# Patient Record
Sex: Female | Born: 1970
Health system: Southern US, Community
[De-identification: ages and names within clinical notes are randomized; demographics above are authoritative.]

## PROBLEM LIST (undated history)

## (undated) DIAGNOSIS — B019 Varicella without complication: Secondary | ICD-10-CM

## (undated) DIAGNOSIS — T7840XA Allergy, unspecified, initial encounter: Secondary | ICD-10-CM

## (undated) HISTORY — DX: Allergy, unspecified, initial encounter: T78.40XA

## (undated) HISTORY — DX: Varicella without complication: B01.9

---

## 1997-11-12 ENCOUNTER — Other Ambulatory Visit: Admission: RE | Admit: 1997-11-12 | Discharge: 1997-11-12 | Payer: Self-pay | Admitting: Obstetrics and Gynecology

## 1998-12-14 ENCOUNTER — Inpatient Hospital Stay (HOSPITAL_COMMUNITY): Admission: AD | Admit: 1998-12-14 | Discharge: 1998-12-17 | Payer: Self-pay | Admitting: *Deleted

## 1999-01-14 ENCOUNTER — Other Ambulatory Visit: Admission: RE | Admit: 1999-01-14 | Discharge: 1999-01-14 | Payer: Self-pay | Admitting: Obstetrics & Gynecology

## 1999-12-19 ENCOUNTER — Other Ambulatory Visit: Admission: RE | Admit: 1999-12-19 | Discharge: 1999-12-19 | Payer: Self-pay | Admitting: Obstetrics and Gynecology

## 2000-01-25 ENCOUNTER — Emergency Department (HOSPITAL_COMMUNITY): Admission: EM | Admit: 2000-01-25 | Discharge: 2000-01-25 | Payer: Self-pay | Admitting: Emergency Medicine

## 2001-01-24 ENCOUNTER — Other Ambulatory Visit: Admission: RE | Admit: 2001-01-24 | Discharge: 2001-01-24 | Payer: Self-pay | Admitting: Obstetrics and Gynecology

## 2001-01-27 ENCOUNTER — Encounter: Admission: RE | Admit: 2001-01-27 | Discharge: 2001-01-27 | Payer: Self-pay | Admitting: Obstetrics and Gynecology

## 2001-01-27 ENCOUNTER — Encounter: Payer: Self-pay | Admitting: Obstetrics and Gynecology

## 2001-10-30 ENCOUNTER — Encounter: Payer: Self-pay | Admitting: Emergency Medicine

## 2001-10-30 ENCOUNTER — Emergency Department (HOSPITAL_COMMUNITY): Admission: EM | Admit: 2001-10-30 | Discharge: 2001-10-30 | Payer: Self-pay | Admitting: Emergency Medicine

## 2002-06-27 ENCOUNTER — Other Ambulatory Visit: Admission: RE | Admit: 2002-06-27 | Discharge: 2002-06-27 | Payer: Self-pay | Admitting: Obstetrics and Gynecology

## 2004-06-05 ENCOUNTER — Other Ambulatory Visit: Admission: RE | Admit: 2004-06-05 | Discharge: 2004-06-05 | Payer: Self-pay | Admitting: Obstetrics and Gynecology

## 2015-11-18 ENCOUNTER — Ambulatory Visit (INDEPENDENT_AMBULATORY_CARE_PROVIDER_SITE_OTHER): Payer: 59 | Admitting: Physician Assistant

## 2015-11-18 ENCOUNTER — Encounter: Payer: Self-pay | Admitting: Physician Assistant

## 2015-11-18 VITALS — BP 110/78 | HR 82 | Temp 97.8°F | Resp 14 | Ht 67.0 in | Wt 157.0 lb

## 2015-11-18 DIAGNOSIS — Z0000 Encounter for general adult medical examination without abnormal findings: Secondary | ICD-10-CM | POA: Insufficient documentation

## 2015-11-18 LAB — CBC
HCT: 39.9 % (ref 36.0–46.0)
Hemoglobin: 13.3 g/dL (ref 12.0–15.0)
MCHC: 33.4 g/dL (ref 30.0–36.0)
MCV: 104.8 fl — ABNORMAL HIGH (ref 78.0–100.0)
Platelets: 345 10*3/uL (ref 150.0–400.0)
RBC: 3.8 Mil/uL — ABNORMAL LOW (ref 3.87–5.11)
RDW: 14.5 % (ref 11.5–15.5)
WBC: 5.5 10*3/uL (ref 4.0–10.5)

## 2015-11-18 LAB — COMPREHENSIVE METABOLIC PANEL
ALT: 52 U/L — ABNORMAL HIGH (ref 0–35)
AST: 71 U/L — ABNORMAL HIGH (ref 0–37)
Albumin: 4.2 g/dL (ref 3.5–5.2)
Alkaline Phosphatase: 80 U/L (ref 39–117)
BUN: 9 mg/dL (ref 6–23)
CO2: 29 mEq/L (ref 19–32)
Calcium: 8.7 mg/dL (ref 8.4–10.5)
Chloride: 102 mEq/L (ref 96–112)
Creatinine, Ser: 0.64 mg/dL (ref 0.40–1.20)
GFR: 107.23 mL/min (ref >60.00)
Glucose, Bld: 79 mg/dL (ref 70–99)
Potassium: 4.5 mEq/L (ref 3.5–5.1)
Sodium: 140 mEq/L (ref 135–145)
Total Bilirubin: 0.4 mg/dL (ref 0.2–1.2)
Total Protein: 7 g/dL (ref 6.0–8.3)

## 2015-11-18 LAB — URINALYSIS, ROUTINE W REFLEX MICROSCOPIC
Bilirubin Urine: NEGATIVE
Ketones, ur: NEGATIVE
Leukocytes, UA: NEGATIVE
Nitrite: NEGATIVE
RBC / HPF: NONE SEEN (ref 0–?)
Specific Gravity, Urine: >=1.03 — AB (ref 1.000–1.030)
Total Protein, Urine: NEGATIVE
Urine Glucose: NEGATIVE
Urobilinogen, UA: 0.2 (ref 0.0–1.0)
WBC, UA: NONE SEEN (ref 0–?)
pH: 5.5 (ref 5.0–8.0)

## 2015-11-18 LAB — LIPID PANEL
Cholesterol: 282 mg/dL — ABNORMAL HIGH (ref 0–200)
HDL: 78.9 mg/dL (ref >39.00)
LDL Cholesterol: 179 mg/dL — ABNORMAL HIGH (ref 0–99)
NonHDL: 202.71
Total CHOL/HDL Ratio: 4
Triglycerides: 119 mg/dL (ref 0.0–149.0)
VLDL: 23.8 mg/dL (ref 0.0–40.0)

## 2015-11-18 LAB — TSH: TSH: 1 u[IU]/mL (ref 0.35–4.50)

## 2015-11-18 LAB — VITAMIN D 25 HYDROXY (VIT D DEFICIENCY, FRACTURES): VITD: 23.79 ng/mL — ABNORMAL LOW (ref 30.00–100.00)

## 2015-11-18 NOTE — Progress Notes (Signed)
Patient presents to clinic today to establish care. Body mass index is 24.59 kg/m. Has been working with a Systems analystpersonal trainer since July. Working out 3 days per week. Patient endorses well-balanced diet. Is working hard on diet and exercise. Mother recently diagnosed with NASH so she is trying to do all she can with diet to prevent getting this herself.   Acute Concerns: Patient denies acute concerns at today's visit.   Chronic Issues: Allergies -- Environmental. Year-round. Zyrtec daily with good relief.   Health Maintenance: Immunizations -- Is unsure of Tetanus. Will check old records. Declines flu shot today.  Mammogram -- Has mammogram scheduled for December. + hx of dense breast tissue. No known family history of breast cancer (adopted).  PAP -- Last in 10/2014. + hx of abnormal PAP smear in 20s. No recent abnormal PAP. Has yearly PAP with GYN.     Past Medical History:  Diagnosis Date  . Allergy    Year-round. Multiple environmental allergies.  . Chickenpox    5th grade    Past Surgical History:  Procedure Laterality Date  . CESAREAN SECTION  2000    No current outpatient prescriptions on file prior to visit.   No current facility-administered medications on file prior to visit.     Allergies  Allergen Reactions  . Erythromycin Nausea Only    Family History  Problem Relation Age of Onset  . Adopted: Yes    Social History   Social History  . Marital status: Married    Spouse name: N/A  . Number of children: N/A  . Years of education: N/A   Occupational History  . 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 Smoking status: Never Smoker  . Smokeless tobacco: Never Used  . Alcohol use 1.8 oz/week    3 Glasses of wine per week  . Drug use: No  . Sexual activity: Yes    Birth control/ protection: IUD     Comment: Mirena   Other Topics Concern  . Not on file   Social History Narrative  . No narrative on file    Review of  Systems  Constitutional: Negative for fever and weight loss.  HENT: Negative for ear discharge, ear pain, hearing loss and tinnitus.        Chronic nasal allergy symptoms  Eyes: Negative for blurred vision, double vision, photophobia and pain.  Respiratory: Negative for cough and shortness of breath.   Cardiovascular: Negative for chest pain and palpitations.  Gastrointestinal: Negative for abdominal pain, blood in stool, constipation, diarrhea, heartburn, melena, nausea and vomiting.  Genitourinary: Negative for dysuria, flank pain, frequency, hematuria and urgency.  Musculoskeletal: Negative for falls.  Neurological: Negative for dizziness, loss of consciousness and headaches.  Endo/Heme/Allergies: Negative for environmental allergies.  Psychiatric/Behavioral: Negative for depression, hallucinations, substance abuse and suicidal ideas. The patient is not nervous/anxious and does not have insomnia.    BP 110/78   Pulse 82   Temp 97.8 F (36.6 C) (Oral)   Resp 14   Ht 5\' 7"  (1.702 m)   Wt 157 lb (71.2 kg)   SpO2 97%   BMI 24.59 kg/m   Physical Exam  Constitutional: She is oriented to person, place, and time and well-developed, well-nourished, and in no distress.  HENT:  Head: Normocephalic and atraumatic.  Right Ear: Tympanic membrane, external ear and ear canal normal.  Left Ear: Tympanic membrane, external ear and ear canal normal.  Nose: Nose normal. No mucosal edema.  Mouth/Throat: Uvula is midline, oropharynx is clear and moist and mucous membranes are normal. No oropharyngeal exudate or posterior oropharyngeal erythema.  Eyes: Conjunctivae are normal. Pupils are equal, round, and reactive to light.  Neck: Neck supple. No thyromegaly present.  Cardiovascular: Normal rate, regular rhythm, normal heart sounds and intact distal pulses.   Pulmonary/Chest: Effort normal and breath sounds normal. No respiratory distress. She has no wheezes. She has no rales.  Abdominal: Soft.  Bowel sounds are normal. She exhibits no distension and no mass. There is no tenderness. There is no rebound and no guarding.  Lymphadenopathy:    She has no cervical adenopathy.  Neurological: She is alert and oriented to person, place, and time. No cranial nerve deficit.  Skin: Skin is warm and dry. No rash noted.  Psychiatric: Affect normal.  Vitals reviewed.  Assessment/Plan: Visit for preventive health examination Depression screen negative. Health Maintenance reviewed -- Declines flu shot. Will obtain old records for tetanus. PAP up-to-date (yearly with GYN). Mammogram scheduled. Preventive schedule discussed and handout given in AVS. Will obtain fasting labs today.   Piedad ClimesMartin, Madonna Flegal Cody, PA-C

## 2015-11-18 NOTE — Patient Instructions (Signed)
Please go to the lab for blood work.   Our office will call you with your results unless you have chosen to receive results via MyChart.  If your blood work is normal we will follow-up each year for physicals and as scheduled for chronic medical problems.  If anything is abnormal we will treat accordingly and get you in for a follow-up.  Continue allergy medications as directed.  Keep up with your diet and exercise!  Preventive Care for Adults, Female A healthy lifestyle and preventive care can promote health and wellness. Preventive health guidelines for women include the following key practices.  A routine yearly physical is a good way to check with your health care provider about your health and preventive screening. It is a chance to share any concerns and updates on your health and to receive a thorough exam.  Visit your dentist for a routine exam and preventive care every 6 months. Brush your teeth twice a day and floss once a day. Good oral hygiene prevents tooth decay and gum disease.  The frequency of eye exams is based on your age, health, family medical history, use of contact lenses, and other factors. Follow your health care provider's recommendations for frequency of eye exams.  Eat a healthy diet. Foods like vegetables, fruits, whole grains, low-fat dairy products, and lean protein foods contain the nutrients you need without too many calories. Decrease your intake of foods high in solid fats, added sugars, and salt. Eat the right amount of calories for you.Get information about a proper diet from your health care provider, if necessary.  Regular physical exercise is one of the most important things you can do for your health. Most adults should get at least 150 minutes of moderate-intensity exercise (any activity that increases your heart rate and causes you to sweat) each week. In addition, most adults need muscle-strengthening exercises on 2 or more days a week.  Maintain  a healthy weight. The body mass index (BMI) is a screening tool to identify possible weight problems. It provides an estimate of body fat based on height and weight. Your health care provider can find your BMI and can help you achieve or maintain a healthy weight.For adults 20 years and older:  A BMI below 18.5 is considered underweight.  A BMI of 18.5 to 24.9 is normal.  A BMI of 25 to 29.9 is considered overweight.  A BMI of 30 and above is considered obese.  Maintain normal blood lipids and cholesterol levels by exercising and minimizing your intake of saturated fat. Eat a balanced diet with plenty of fruit and vegetables. Blood tests for lipids and cholesterol should begin at age 85 and be repeated every 5 years. If your lipid or cholesterol levels are high, you are over 50, or you are at high risk for heart disease, you may need your cholesterol levels checked more frequently.Ongoing high lipid and cholesterol levels should be treated with medicines if diet and exercise are not working.  If you smoke, find out from your health care provider how to quit. If you do not use tobacco, do not start.  Lung cancer screening is recommended for adults aged 10-80 years who are at high risk for developing lung cancer because of a history of smoking. A yearly low-dose CT scan of the lungs is recommended for people who have at least a 30-pack-year history of smoking and are a current smoker or have quit within the past 15 years. A pack year of smoking  is smoking an average of 1 pack of cigarettes a day for 1 year (for example: 1 pack a day for 30 years or 2 packs a day for 15 years). Yearly screening should continue until the smoker has stopped smoking for at least 15 years. Yearly screening should be stopped for people who develop a health problem that would prevent them from having lung cancer treatment.  If you are pregnant, do not drink alcohol. If you are breastfeeding, be very cautious about drinking  alcohol. If you are not pregnant and choose to drink alcohol, do not have more than 1 drink per day. One drink is considered to be 12 ounces (355 mL) of beer, 5 ounces (148 mL) of wine, or 1.5 ounces (44 mL) of liquor.  Avoid use of street drugs. Do not share needles with anyone. Ask for help if you need support or instructions about stopping the use of drugs.  High blood pressure causes heart disease and increases the risk of stroke. Your blood pressure should be checked at least every 1 to 2 years. Ongoing high blood pressure should be treated with medicines if weight loss and exercise do not work.  If you are 66-37 years old, ask your health care provider if you should take aspirin to prevent strokes.  Diabetes screening is done by taking a blood sample to check your blood glucose level after you have not eaten for a certain period of time (fasting). If you are not overweight and you do not have risk factors for diabetes, you should be screened once every 3 years starting at age 27. If you are overweight or obese and you are 74-40 years of age, you should be screened for diabetes every year as part of your cardiovascular risk assessment.  Breast cancer screening is essential preventive care for women. You should practice "breast self-awareness." This means understanding the normal appearance and feel of your breasts and may include breast self-examination. Any changes detected, no matter how small, should be reported to a health care provider. Women in their 16s and 30s should have a clinical breast exam (CBE) by a health care provider as part of a regular health exam every 1 to 3 years. After age 75, women should have a CBE every year. Starting at age 104, women should consider having a mammogram (breast X-ray test) every year. Women who have a family history of breast cancer should talk to their health care provider about genetic screening. Women at a high risk of breast cancer should talk to their  health care providers about having an MRI and a mammogram every year.  Breast cancer gene (BRCA)-related cancer risk assessment is recommended for women who have family members with BRCA-related cancers. BRCA-related cancers include breast, ovarian, tubal, and peritoneal cancers. Having family members with these cancers may be associated with an increased risk for harmful changes (mutations) in the breast cancer genes BRCA1 and BRCA2. Results of the assessment will determine the need for genetic counseling and BRCA1 and BRCA2 testing.  Your health care provider may recommend that you be screened regularly for cancer of the pelvic organs (ovaries, uterus, and vagina). This screening involves a pelvic examination, including checking for microscopic changes to the surface of your cervix (Pap test). You may be encouraged to have this screening done every 3 years, beginning at age 47.  For women ages 30-65, health care providers may recommend pelvic exams and Pap testing every 3 years, or they may recommend the Pap and pelvic  exam, combined with testing for human papilloma virus (HPV), every 5 years. Some types of HPV increase your risk of cervical cancer. Testing for HPV may also be done on women of any age with unclear Pap test results.  Other health care providers may not recommend any screening for nonpregnant women who are considered low risk for pelvic cancer and who do not have symptoms. Ask your health care provider if a screening pelvic exam is right for you.  If you have had past treatment for cervical cancer or a condition that could lead to cancer, you need Pap tests and screening for cancer for at least 20 years after your treatment. If Pap tests have been discontinued, your risk factors (such as having a new sexual partner) need to be reassessed to determine if screening should resume. Some women have medical problems that increase the chance of getting cervical cancer. In these cases, your health  care provider may recommend more frequent screening and Pap tests.  Colorectal cancer can be detected and often prevented. Most routine colorectal cancer screening begins at the age of 76 years and continues through age 22 years. However, your health care provider may recommend screening at an earlier age if you have risk factors for colon cancer. On a yearly basis, your health care provider may provide home test kits to check for hidden blood in the stool. Use of a small camera at the end of a tube, to directly examine the colon (sigmoidoscopy or colonoscopy), can detect the earliest forms of colorectal cancer. Talk to your health care provider about this at age 81, when routine screening begins. Direct exam of the colon should be repeated every 5-10 years through age 2 years, unless early forms of precancerous polyps or small growths are found.  People who are at an increased risk for hepatitis B should be screened for this virus. You are considered at high risk for hepatitis B if:  You were born in a country where hepatitis B occurs often. Talk with your health care provider about which countries are considered high risk.  Your parents were born in a high-risk country and you have not received a shot to protect against hepatitis B (hepatitis B vaccine).  You have HIV or AIDS.  You use needles to inject street drugs.  You live with, or have sex with, someone who has hepatitis B.  You get hemodialysis treatment.  You take certain medicines for conditions like cancer, organ transplantation, and autoimmune conditions.  Hepatitis C blood testing is recommended for all people born from [birth year] through 1965 and any individual with known risks for hepatitis C.  Practice safe sex. Use condoms and avoid high-risk sexual practices to reduce the spread of sexually transmitted infections (STIs). STIs include gonorrhea, chlamydia, syphilis, trichomonas, herpes, HPV, and human immunodeficiency virus (HIV).  Herpes, HIV, and HPV are viral illnesses that have no cure. They can result in disability, cancer, and death.  You should be screened for sexually transmitted illnesses (STIs) including gonorrhea and chlamydia if:  You are sexually active and are younger than 24 years.  You are older than 24 years and your health care provider tells you that you are at risk for this type of infection.  Your sexual activity has changed since you were last screened and you are at an increased risk for chlamydia or gonorrhea. Ask your health care provider if you are at risk.  If you are at risk of being infected with HIV, it is recommended  that you take a prescription medicine daily to prevent HIV infection. This is called preexposure prophylaxis (PrEP). You are considered at risk if:  You are sexually active and do not regularly use condoms or know the HIV status of your partner(s).  You take drugs by injection.  You are sexually active with a partner who has HIV.  Talk with your health care provider about whether you are at high risk of being infected with HIV. If you choose to begin PrEP, you should first be tested for HIV. You should then be tested every 3 months for as long as you are taking PrEP.  Osteoporosis is a disease in which the bones lose minerals and strength with aging. This can result in serious bone fractures or breaks. The risk of osteoporosis can be identified using a bone density scan. Women ages 51 years and over and women at risk for fractures or osteoporosis should discuss screening with their health care providers. Ask your health care provider whether you should take a calcium supplement or vitamin D to reduce the rate of osteoporosis.  Menopause can be associated with physical symptoms and risks. Hormone replacement therapy is available to decrease symptoms and risks. You should talk to your health care provider about whether hormone replacement therapy is right for you.  Use  sunscreen. Apply sunscreen liberally and repeatedly throughout the day. You should seek shade when your shadow is shorter than you. Protect yourself by wearing long sleeves, pants, a wide-brimmed hat, and sunglasses year round, whenever you are outdoors.  Once a month, do a whole body skin exam, using a mirror to look at the skin on your back. Tell your health care provider of new moles, moles that have irregular borders, moles that are larger than a pencil eraser, or moles that have changed in shape or color.  Stay current with required vaccines (immunizations).  Influenza vaccine. All adults should be immunized every year.  Tetanus, diphtheria, and acellular pertussis (Td, Tdap) vaccine. Pregnant women should receive 1 dose of Tdap vaccine during each pregnancy. The dose should be obtained regardless of the length of time since the last dose. Immunization is preferred during the 27th-36th week of gestation. An adult who has not previously received Tdap or who does not know her vaccine status should receive 1 dose of Tdap. This initial dose should be followed by tetanus and diphtheria toxoids (Td) booster doses every 10 years. Adults with an unknown or incomplete history of completing a 3-dose immunization series with Td-containing vaccines should begin or complete a primary immunization series including a Tdap dose. Adults should receive a Td booster every 10 years.  Varicella vaccine. An adult without evidence of immunity to varicella should receive 2 doses or a second dose if she has previously received 1 dose. Pregnant females who do not have evidence of immunity should receive the first dose after pregnancy. This first dose should be obtained before leaving the health care facility. The second dose should be obtained 4-8 weeks after the first dose.  Human papillomavirus (HPV) vaccine. Females aged 13-26 years who have not received the vaccine previously should obtain the 3-dose series. The vaccine  is not recommended for use in pregnant females. However, pregnancy testing is not needed before receiving a dose. If a female is found to be pregnant after receiving a dose, no treatment is needed. In that case, the remaining doses should be delayed until after the pregnancy. Immunization is recommended for any person with an immunocompromised condition  through the age of 17 years if she did not get any or all doses earlier. During the 3-dose series, the second dose should be obtained 4-8 weeks after the first dose. The third dose should be obtained 24 weeks after the first dose and 16 weeks after the second dose.  Zoster vaccine. One dose is recommended for adults aged 72 years or older unless certain conditions are present.  Measles, mumps, and rubella (MMR) vaccine. Adults born before [birth year] generally are considered immune to measles and mumps. Adults born in [birth year] or later should have 1 or more doses of MMR vaccine unless there is a contraindication to the vaccine or there is laboratory evidence of immunity to each of the three diseases. A routine second dose of MMR vaccine should be obtained at least 28 days after the first dose for students attending postsecondary schools, health care workers, or international travelers. People who received inactivated measles vaccine or an unknown type of measles vaccine during 1963-1967 should receive 2 doses of MMR vaccine. People who received inactivated mumps vaccine or an unknown type of mumps vaccine before 1979 and are at high risk for mumps infection should consider immunization with 2 doses of MMR vaccine. For females of childbearing age, rubella immunity should be determined. If there is no evidence of immunity, females who are not pregnant should be vaccinated. If there is no evidence of immunity, females who are pregnant should delay immunization until after pregnancy. Unvaccinated health care workers born before [birth year] who lack laboratory evidence of measles,  mumps, or rubella immunity or laboratory confirmation of disease should consider measles and mumps immunization with 2 doses of MMR vaccine or rubella immunization with 1 dose of MMR vaccine.  Pneumococcal 13-valent conjugate (PCV13) vaccine. When indicated, a person who is uncertain of his immunization history and has no record of immunization should receive the PCV13 vaccine. All adults 23 years of age and older should receive this vaccine. An adult aged 20 years or older who has certain medical conditions and has not been previously immunized should receive 1 dose of PCV13 vaccine. This PCV13 should be followed with a dose of pneumococcal polysaccharide (PPSV23) vaccine. Adults who are at high risk for pneumococcal disease should obtain the PPSV23 vaccine at least 8 weeks after the dose of PCV13 vaccine. Adults older than 43 years of age who have normal immune system function should obtain the PPSV23 vaccine dose at least 1 year after the dose of PCV13 vaccine.  Pneumococcal polysaccharide (PPSV23) vaccine. When PCV13 is also indicated, PCV13 should be obtained first. All adults aged 37 years and older should be immunized. An adult younger than age 73 years who has certain medical conditions should be immunized. Any person who resides in a nursing home or long-term care facility should be immunized. An adult smoker should be immunized. People with an immunocompromised condition and certain other conditions should receive both PCV13 and PPSV23 vaccines. People with human immunodeficiency virus (HIV) infection should be immunized as soon as possible after diagnosis. Immunization during chemotherapy or radiation therapy should be avoided. Routine use of PPSV23 vaccine is not recommended for American Indians, [place] Natives, or people younger than 65 years unless there are medical conditions that require PPSV23 vaccine. When indicated, people who have unknown immunization and have no record of immunization should  receive PPSV23 vaccine. One-time revaccination 5 years after the first dose of PPSV23 is recommended for people aged 19-64 years who have chronic kidney failure, nephrotic syndrome, asplenia, or immunocompromised  conditions. People who received 1-2 doses of PPSV23 before age 32 years should receive another dose of PPSV23 vaccine at age 74 years or later if at least 5 years have passed since the previous dose. Doses of PPSV23 are not needed for people immunized with PPSV23 at or after age 8 years.  Meningococcal vaccine. Adults with asplenia or persistent complement component deficiencies should receive 2 doses of quadrivalent meningococcal conjugate (MenACWY-D) vaccine. The doses should be obtained at least 2 months apart. Microbiologists working with certain meningococcal bacteria, [place] recruits, people at risk during an outbreak, and people who travel to or live in countries with a high rate of meningitis should be immunized. A first-year college student up through age 12 years who is living in a residence hall should receive a dose if she did not receive a dose on or after her 16th birthday. Adults who have certain high-risk conditions should receive one or more doses of vaccine.  Hepatitis A vaccine. Adults who wish to be protected from this disease, have certain high-risk conditions, work with hepatitis A-infected animals, work in hepatitis A research labs, or travel to or work in countries with a high rate of hepatitis A should be immunized. Adults who were previously unvaccinated and who anticipate close contact with an international adoptee during the first 60 days after arrival in the [place] from a country with a high rate of hepatitis A should be immunized.  Hepatitis B vaccine. Adults who wish to be protected from this disease, have certain high-risk conditions, may be exposed to blood or other infectious body fluids, are household contacts or sex partners of hepatitis B positive people,  are clients or workers in certain care facilities, or travel to or work in countries with a high rate of hepatitis B should be immunized.  Haemophilus influenzae type b (Hib) vaccine. A previously unvaccinated person with asplenia or sickle cell disease or having a scheduled splenectomy should receive 1 dose of Hib vaccine. Regardless of previous immunization, a recipient of a hematopoietic stem cell transplant should receive a 3-dose series 6-12 months after her successful transplant. Hib vaccine is not recommended for adults with HIV infection. Preventive Services / Frequency Ages 17 to 81 years  Blood pressure check.** / Every 3-5 years.  Lipid and cholesterol check.** / Every 5 years beginning at age 28.  Clinical breast exam.** / Every 3 years for women in their 17s and 44s.  BRCA-related cancer risk assessment.** / For women who have family members with a BRCA-related cancer (breast, ovarian, tubal, or peritoneal cancers).  Pap test.** / Every 2 years from ages 43 through 35. Every 3 years starting at age 48 through age 73 or 59 with a history of 3 consecutive normal Pap tests.  HPV screening.** / Every 3 years from ages 22 through ages 21 to 34 with a history of 3 consecutive normal Pap tests.  Hepatitis C blood test.** / For any individual with known risks for hepatitis C.  Skin self-exam. / Monthly.  Influenza vaccine. / Every year.  Tetanus, diphtheria, and acellular pertussis (Tdap, Td) vaccine.** / Consult your health care provider. Pregnant women should receive 1 dose of Tdap vaccine during each pregnancy. 1 dose of Td every 10 years.  Varicella vaccine.** / Consult your health care provider. Pregnant females who do not have evidence of immunity should receive the first dose after pregnancy.  HPV vaccine. / 3 doses over 6 months, if 25 and younger. The vaccine is not recommended for  use in pregnant females. However, pregnancy testing is not needed before receiving a  dose.  Measles, mumps, rubella (MMR) vaccine.** / You need at least 1 dose of MMR if you were born in 1957 or later. You may also need a 2nd dose. For females of childbearing age, rubella immunity should be determined. If there is no evidence of immunity, females who are not pregnant should be vaccinated. If there is no evidence of immunity, females who are pregnant should delay immunization until after pregnancy.  Pneumococcal 13-valent conjugate (PCV13) vaccine.** / Consult your health care provider.  Pneumococcal polysaccharide (PPSV23) vaccine.** / 1 to 2 doses if you smoke cigarettes or if you have certain conditions.  Meningococcal vaccine.** / 1 dose if you are age 67 to 6 years and a Market researcher living in a residence hall, or have one of several medical conditions, you need to get vaccinated against meningococcal disease. You may also need additional booster doses.  Hepatitis A vaccine.** / Consult your health care provider.  Hepatitis B vaccine.** / Consult your health care provider.  Haemophilus influenzae type b (Hib) vaccine.** / Consult your health care provider. Ages 53 to 53 years  Blood pressure check.** / Every year.  Lipid and cholesterol check.** / Every 5 years beginning at age 55 years.  Lung cancer screening. / Every year if you are aged 77-80 years and have a 30-pack-year history of smoking and currently smoke or have quit within the past 15 years. Yearly screening is stopped once you have quit smoking for at least 15 years or develop a health problem that would prevent you from having lung cancer treatment.  Clinical breast exam.** / Every year after age 72 years.  BRCA-related cancer risk assessment.** / For women who have family members with a BRCA-related cancer (breast, ovarian, tubal, or peritoneal cancers).  Mammogram.** / Every year beginning at age 72 years and continuing for as long as you are in good health. Consult with your health care  provider.  Pap test.** / Every 3 years starting at age 8 years through age 52 or 26 years with a history of 3 consecutive normal Pap tests.  HPV screening.** / Every 3 years from ages 71 years through ages 86 to 60 years with a history of 3 consecutive normal Pap tests.  Fecal occult blood test (FOBT) of stool. / Every year beginning at age 44 years and continuing until age 7 years. You may not need to do this test if you get a colonoscopy every 10 years.  Flexible sigmoidoscopy or colonoscopy.** / Every 5 years for a flexible sigmoidoscopy or every 10 years for a colonoscopy beginning at age 78 years and continuing until age 82 years.  Hepatitis C blood test.** / For all people born from [birth year] through 1963 and any individual with known risks for hepatitis C.  Skin self-exam. / Monthly.  Influenza vaccine. / Every year.  Tetanus, diphtheria, and acellular pertussis (Tdap/Td) vaccine.** / Consult your health care provider. Pregnant women should receive 1 dose of Tdap vaccine during each pregnancy. 1 dose of Td every 10 years.  Varicella vaccine.** / Consult your health care provider. Pregnant females who do not have evidence of immunity should receive the first dose after pregnancy.  Zoster vaccine.** / 1 dose for adults aged 28 years or older.  Measles, mumps, rubella (MMR) vaccine.** / You need at least 1 dose of MMR if you were born in 1957 or later. You may also need a second  dose. For females of childbearing age, rubella immunity should be determined. If there is no evidence of immunity, females who are not pregnant should be vaccinated. If there is no evidence of immunity, females who are pregnant should delay immunization until after pregnancy.  Pneumococcal 13-valent conjugate (PCV13) vaccine.** / Consult your health care provider.  Pneumococcal polysaccharide (PPSV23) vaccine.** / 1 to 2 doses if you smoke cigarettes or if you have certain conditions.  Meningococcal vaccine.** /  Consult your health care provider.  Hepatitis A vaccine.** / Consult your health care provider.  Hepatitis B vaccine.** / Consult your health care provider.  Haemophilus influenzae type b (Hib) vaccine.** / Consult your health care provider. Ages 28 years and over  Blood pressure check.** / Every year.  Lipid and cholesterol check.** / Every 5 years beginning at age 54 years.  Lung cancer screening. / Every year if you are aged 83-80 years and have a 30-pack-year history of smoking and currently smoke or have quit within the past 15 years. Yearly screening is stopped once you have quit smoking for at least 15 years or develop a health problem that would prevent you from having lung cancer treatment.  Clinical breast exam.** / Every year after age 55 years.  BRCA-related cancer risk assessment.** / For women who have family members with a BRCA-related cancer (breast, ovarian, tubal, or peritoneal cancers).  Mammogram.** / Every year beginning at age 31 years and continuing for as long as you are in good health. Consult with your health care provider.  Pap test.** / Every 3 years starting at age 28 years through age 80 or 85 years with 3 consecutive normal Pap tests. Testing can be stopped between 65 and 70 years with 3 consecutive normal Pap tests and no abnormal Pap or HPV tests in the past 10 years.  HPV screening.** / Every 3 years from ages 70 years through ages 52 or 14 years with a history of 3 consecutive normal Pap tests. Testing can be stopped between 65 and 70 years with 3 consecutive normal Pap tests and no abnormal Pap or HPV tests in the past 10 years.  Fecal occult blood test (FOBT) of stool. / Every year beginning at age 39 years and continuing until age 28 years. You may not need to do this test if you get a colonoscopy every 10 years.  Flexible sigmoidoscopy or colonoscopy.** / Every 5 years for a flexible sigmoidoscopy or every 10 years for a colonoscopy beginning at age  35 years and continuing until age 37 years.  Hepatitis C blood test.** / For all people born from [birth year] through 1963 and any individual with known risks for hepatitis C.  Osteoporosis screening.** / A one-time screening for women ages 34 years and over and women at risk for fractures or osteoporosis.  Skin self-exam. / Monthly.  Influenza vaccine. / Every year.  Tetanus, diphtheria, and acellular pertussis (Tdap/Td) vaccine.** / 1 dose of Td every 10 years.  Varicella vaccine.** / Consult your health care provider.  Zoster vaccine.** / 1 dose for adults aged 63 years or older.  Pneumococcal 13-valent conjugate (PCV13) vaccine.** / Consult your health care provider.  Pneumococcal polysaccharide (PPSV23) vaccine.** / 1 dose for all adults aged 21 years and older.  Meningococcal vaccine.** / Consult your health care provider.  Hepatitis A vaccine.** / Consult your health care provider.  Hepatitis B vaccine.** / Consult your health care provider.  Haemophilus influenzae type b (Hib) vaccine.** / Consult your health care  provider. ** Family history and personal history of risk and conditions may change your health care provider's recommendations.   This information is not intended to replace advice given to you by your health care provider. Make sure you discuss any questions you have with your health care provider.   Document Released: 02/17/2001 Document Revised: 01/12/2014 Document Reviewed: 05/19/2010 Elsevier Interactive Patient Education [employer].

## 2015-11-18 NOTE — Assessment & Plan Note (Signed)
Depression screen negative. Health Maintenance reviewed -- Declines flu shot. Will obtain old records for tetanus. PAP up-to-date (yearly with GYN). Mammogram scheduled. Preventive schedule discussed and handout given in AVS. Will obtain fasting labs today.

## 2015-11-18 NOTE — Progress Notes (Signed)
Pre visit review using our clinic review tool, if applicable. No additional management support is needed unless otherwise documented below in the visit note. 

## 2015-11-18 NOTE — Addendum Note (Signed)
Addended by: Con MemosMOORE, Demeshia Sherburne S on: 11/18/2015 11:01 AM   Modules accepted: Orders

## 2015-11-19 ENCOUNTER — Other Ambulatory Visit: Payer: Self-pay | Admitting: Emergency Medicine

## 2015-11-19 DIAGNOSIS — R748 Abnormal levels of other serum enzymes: Secondary | ICD-10-CM

## 2015-11-19 MED ORDER — VITAMIN D-3 25 MCG (1000 UT) PO CAPS: 1.0000 | ORAL_CAPSULE | Freq: Every day | ORAL | 0 refills | Status: AC

## 2015-12-03 ENCOUNTER — Other Ambulatory Visit: Payer: 59

## 2016-01-30 DIAGNOSIS — Z01419 Encounter for gynecological examination (general) (routine) without abnormal findings: Secondary | ICD-10-CM | POA: Diagnosis not present

## 2016-01-30 DIAGNOSIS — Z124 Encounter for screening for malignant neoplasm of cervix: Secondary | ICD-10-CM | POA: Diagnosis not present

## 2016-02-18 DIAGNOSIS — Z23 Encounter for immunization: Secondary | ICD-10-CM | POA: Diagnosis not present

## 2017-01-28 DIAGNOSIS — M9901 Segmental and somatic dysfunction of cervical region: Secondary | ICD-10-CM | POA: Diagnosis not present

## 2017-01-28 DIAGNOSIS — M531 Cervicobrachial syndrome: Secondary | ICD-10-CM | POA: Diagnosis not present

## 2017-01-28 DIAGNOSIS — M9902 Segmental and somatic dysfunction of thoracic region: Secondary | ICD-10-CM | POA: Diagnosis not present

## 2017-03-04 DIAGNOSIS — Z124 Encounter for screening for malignant neoplasm of cervix: Secondary | ICD-10-CM | POA: Diagnosis not present

## 2017-03-04 DIAGNOSIS — Z1231 Encounter for screening mammogram for malignant neoplasm of breast: Secondary | ICD-10-CM | POA: Diagnosis not present

## 2017-03-04 DIAGNOSIS — Z01419 Encounter for gynecological examination (general) (routine) without abnormal findings: Secondary | ICD-10-CM | POA: Diagnosis not present

## 2017-04-12 DIAGNOSIS — M531 Cervicobrachial syndrome: Secondary | ICD-10-CM | POA: Diagnosis not present

## 2017-04-12 DIAGNOSIS — M9902 Segmental and somatic dysfunction of thoracic region: Secondary | ICD-10-CM | POA: Diagnosis not present

## 2017-04-12 DIAGNOSIS — M9901 Segmental and somatic dysfunction of cervical region: Secondary | ICD-10-CM | POA: Diagnosis not present

## 2017-05-28 ENCOUNTER — Encounter: Payer: Self-pay | Admitting: Emergency Medicine

## 2017-08-23 DIAGNOSIS — M9901 Segmental and somatic dysfunction of cervical region: Secondary | ICD-10-CM | POA: Diagnosis not present

## 2017-08-23 DIAGNOSIS — M9902 Segmental and somatic dysfunction of thoracic region: Secondary | ICD-10-CM | POA: Diagnosis not present

## 2017-08-23 DIAGNOSIS — M531 Cervicobrachial syndrome: Secondary | ICD-10-CM | POA: Diagnosis not present

## 2017-08-30 DIAGNOSIS — M9902 Segmental and somatic dysfunction of thoracic region: Secondary | ICD-10-CM | POA: Diagnosis not present

## 2017-08-30 DIAGNOSIS — M531 Cervicobrachial syndrome: Secondary | ICD-10-CM | POA: Diagnosis not present

## 2017-08-30 DIAGNOSIS — M9901 Segmental and somatic dysfunction of cervical region: Secondary | ICD-10-CM | POA: Diagnosis not present

## 2018-10-20 ENCOUNTER — Other Ambulatory Visit: Payer: Self-pay | Admitting: Physician Assistant

## 2018-10-20 DIAGNOSIS — M25561 Pain in right knee: Secondary | ICD-10-CM

## 2018-10-31 ENCOUNTER — Other Ambulatory Visit: Payer: 59

## 2018-12-07 LAB — HM PAP SMEAR: HM Pap smear: NEGATIVE

## 2018-12-07 LAB — HM MAMMOGRAPHY

## 2019-01-07 ENCOUNTER — Emergency Department (HOSPITAL_COMMUNITY): Payer: 59

## 2019-01-07 ENCOUNTER — Inpatient Hospital Stay (HOSPITAL_COMMUNITY)
Admission: EM | Admit: 2019-01-07 | Discharge: 2019-01-12 | DRG: 439 | Disposition: A | Discharge status: Home / Self Care | Payer: 59 | Attending: Internal Medicine | Admitting: Internal Medicine

## 2019-01-07 ENCOUNTER — Other Ambulatory Visit: Payer: Self-pay

## 2019-01-07 ENCOUNTER — Encounter (HOSPITAL_COMMUNITY): Payer: Self-pay | Admitting: Emergency Medicine

## 2019-01-07 DIAGNOSIS — K760 Fatty (change of) liver, not elsewhere classified: Secondary | ICD-10-CM | POA: Diagnosis present

## 2019-01-07 DIAGNOSIS — R112 Nausea with vomiting, unspecified: Secondary | ICD-10-CM | POA: Diagnosis present

## 2019-01-07 DIAGNOSIS — E876 Hypokalemia: Secondary | ICD-10-CM | POA: Diagnosis present

## 2019-01-07 DIAGNOSIS — F10230 Alcohol dependence with withdrawal, uncomplicated: Secondary | ICD-10-CM | POA: Diagnosis not present

## 2019-01-07 DIAGNOSIS — R7989 Other specified abnormal findings of blood chemistry: Secondary | ICD-10-CM | POA: Diagnosis not present

## 2019-01-07 DIAGNOSIS — I10 Essential (primary) hypertension: Secondary | ICD-10-CM | POA: Diagnosis present

## 2019-01-07 DIAGNOSIS — K859 Acute pancreatitis without necrosis or infection, unspecified: Secondary | ICD-10-CM

## 2019-01-07 DIAGNOSIS — R160 Hepatomegaly, not elsewhere classified: Secondary | ICD-10-CM | POA: Diagnosis present

## 2019-01-07 DIAGNOSIS — Z20822 Contact with and (suspected) exposure to covid-19: Secondary | ICD-10-CM | POA: Diagnosis present

## 2019-01-07 DIAGNOSIS — F1010 Alcohol abuse, uncomplicated: Secondary | ICD-10-CM

## 2019-01-07 DIAGNOSIS — Z720 Tobacco use: Secondary | ICD-10-CM

## 2019-01-07 DIAGNOSIS — D7589 Other specified diseases of blood and blood-forming organs: Secondary | ICD-10-CM

## 2019-01-07 DIAGNOSIS — F10939 Alcohol use, unspecified with withdrawal, unspecified: Secondary | ICD-10-CM

## 2019-01-07 DIAGNOSIS — K852 Alcohol induced acute pancreatitis without necrosis or infection: Principal | ICD-10-CM

## 2019-01-07 DIAGNOSIS — R9431 Abnormal electrocardiogram [ECG] [EKG]: Secondary | ICD-10-CM | POA: Diagnosis not present

## 2019-01-07 DIAGNOSIS — F17210 Nicotine dependence, cigarettes, uncomplicated: Secondary | ICD-10-CM | POA: Diagnosis present

## 2019-01-07 DIAGNOSIS — F10239 Alcohol dependence with withdrawal, unspecified: Secondary | ICD-10-CM | POA: Diagnosis present

## 2019-01-07 LAB — CREATININE, SERUM
Creatinine, Ser: 0.53 mg/dL (ref 0.44–1.00)
GFR calc Af Amer: >60 mL/min (ref >60)
GFR calc non Af Amer: >60 mL/min (ref >60)

## 2019-01-07 LAB — CBC
HCT: 40.2 % (ref 36.0–46.0)
HCT: 41.3 % (ref 36.0–46.0)
Hemoglobin: 13.9 g/dL (ref 12.0–15.0)
Hemoglobin: 14.1 g/dL (ref 12.0–15.0)
MCH: 38.4 pg — ABNORMAL HIGH (ref 26.0–34.0)
MCH: 38.5 pg — ABNORMAL HIGH (ref 26.0–34.0)
MCHC: 34.6 g/dL (ref 30.0–36.0)
MCHC: 34.1 g/dL (ref 30.0–36.0)
MCV: 111 fL — ABNORMAL HIGH (ref 80.0–100.0)
MCV: 112.8 fL — ABNORMAL HIGH (ref 80.0–100.0)
Platelets: 223 10*3/uL (ref 150–400)
Platelets: 215 10*3/uL (ref 150–400)
RBC: 3.62 MIL/uL — ABNORMAL LOW (ref 3.87–5.11)
RBC: 3.66 MIL/uL — ABNORMAL LOW (ref 3.87–5.11)
RDW: 13.2 % (ref 11.5–15.5)
RDW: 13.2 % (ref 11.5–15.5)
WBC: 9.3 10*3/uL (ref 4.0–10.5)
WBC: 11.9 10*3/uL — ABNORMAL HIGH (ref 4.0–10.5)
nRBC: 0 % (ref 0.0–0.2)
nRBC: 0 % (ref 0.0–0.2)

## 2019-01-07 LAB — COMPREHENSIVE METABOLIC PANEL
ALT: 82 U/L — ABNORMAL HIGH (ref 0–44)
AST: 155 U/L — ABNORMAL HIGH (ref 15–41)
Albumin: 4 g/dL (ref 3.5–5.0)
Alkaline Phosphatase: 154 U/L — ABNORMAL HIGH (ref 38–126)
Anion gap: 18 — ABNORMAL HIGH (ref 5–15)
BUN: 7 mg/dL (ref 6–20)
CO2: 25 mmol/L (ref 22–32)
Calcium: 8.5 mg/dL — ABNORMAL LOW (ref 8.9–10.3)
Chloride: 95 mmol/L — ABNORMAL LOW (ref 98–111)
Creatinine, Ser: 0.56 mg/dL (ref 0.44–1.00)
GFR calc Af Amer: >60 mL/min (ref >60)
GFR calc non Af Amer: >60 mL/min (ref >60)
Glucose, Bld: 124 mg/dL — ABNORMAL HIGH (ref 70–99)
Potassium: 2.9 mmol/L — ABNORMAL LOW (ref 3.5–5.1)
Sodium: 138 mmol/L (ref 135–145)
Total Bilirubin: 2.4 mg/dL — ABNORMAL HIGH (ref 0.3–1.2)
Total Protein: 7.5 g/dL (ref 6.5–8.1)

## 2019-01-07 LAB — PROTIME-INR
INR: 1.1 (ref 0.8–1.2)
Prothrombin Time: 14.2 seconds (ref 11.4–15.2)

## 2019-01-07 LAB — TSH
TSH: 1.007 u[IU]/mL (ref 0.350–4.500)
TSH: 1.075 u[IU]/mL (ref 0.350–4.500)

## 2019-01-07 LAB — LIPID PANEL
Cholesterol: 244 mg/dL — ABNORMAL HIGH (ref 0–200)
HDL: 51 mg/dL (ref >40)
LDL Cholesterol: 175 mg/dL — ABNORMAL HIGH (ref 0–99)
Total CHOL/HDL Ratio: 4.8 RATIO
Triglycerides: 92 mg/dL (ref <150)
VLDL: 18 mg/dL (ref 0–40)

## 2019-01-07 LAB — SARS CORONAVIRUS 2 (TAT 6-24 HRS): SARS Coronavirus 2: NEGATIVE

## 2019-01-07 LAB — URINALYSIS, ROUTINE W REFLEX MICROSCOPIC
Bacteria, UA: NONE SEEN
Bilirubin Urine: NEGATIVE
Glucose, UA: NEGATIVE mg/dL
Hgb urine dipstick: NEGATIVE
Ketones, ur: 80 mg/dL — AB
Leukocytes,Ua: NEGATIVE
Nitrite: NEGATIVE
Protein, ur: 30 mg/dL — AB
Specific Gravity, Urine: 1.018 (ref 1.005–1.030)
pH: 6 (ref 5.0–8.0)

## 2019-01-07 LAB — HIV ANTIBODY (ROUTINE TESTING W REFLEX): HIV Screen 4th Generation wRfx: NONREACTIVE

## 2019-01-07 LAB — LIPASE, BLOOD: Lipase: 569 U/L — ABNORMAL HIGH (ref 11–51)

## 2019-01-07 LAB — MAGNESIUM: Magnesium: 1 mg/dL — ABNORMAL LOW (ref 1.7–2.4)

## 2019-01-07 LAB — I-STAT BETA HCG BLOOD, ED (MC, WL, AP ONLY): I-stat hCG, quantitative: <5 m[IU]/mL (ref <5)

## 2019-01-07 LAB — VITAMIN B12: Vitamin B-12: 190 pg/mL (ref 180–914)

## 2019-01-07 LAB — ETHANOL: Alcohol, Ethyl (B): 63 mg/dL — ABNORMAL HIGH (ref <10)

## 2019-01-07 MED ORDER — HYDRALAZINE HCL 25 MG PO TABS
25.0000 mg | ORAL_TABLET | Freq: Four times a day (QID) | ORAL | Status: DC | PRN
  Administered 2019-01-07: 25 mg via ORAL
  Filled 2019-01-07: qty 1

## 2019-01-07 MED ORDER — ADULT MULTIVITAMIN W/MINERALS CH
1.0000 | ORAL_TABLET | Freq: Every day | ORAL | Status: DC
  Administered 2019-01-08 – 2019-01-12 (×5): 1 via ORAL
  Filled 2019-01-07 (×5): qty 1

## 2019-01-07 MED ORDER — LORAZEPAM 1 MG PO TABS
1.0000 mg | ORAL_TABLET | ORAL | Status: AC | PRN
  Administered 2019-01-07 – 2019-01-08 (×2): 2 mg via ORAL
  Filled 2019-01-07 (×2): qty 2

## 2019-01-07 MED ORDER — LORAZEPAM 2 MG/ML IJ SOLN
1.0000 mg | INTRAMUSCULAR | Status: AC | PRN
  Administered 2019-01-07 – 2019-01-08 (×4): 2 mg via INTRAVENOUS
  Administered 2019-01-08: 3 mg via INTRAVENOUS
  Administered 2019-01-08 (×3): 2 mg via INTRAVENOUS
  Administered 2019-01-09 (×3): 3 mg via INTRAVENOUS
  Administered 2019-01-09 (×3): 2 mg via INTRAVENOUS
  Administered 2019-01-10 (×4): 4 mg via INTRAVENOUS
  Filled 2019-01-07 (×3): qty 1
  Filled 2019-01-07: qty 2
  Filled 2019-01-07: qty 1
  Filled 2019-01-07: qty 2
  Filled 2019-01-07 (×2): qty 1
  Filled 2019-01-07 (×2): qty 2
  Filled 2019-01-07: qty 1
  Filled 2019-01-07: qty 2
  Filled 2019-01-07 (×3): qty 1
  Filled 2019-01-07: qty 2
  Filled 2019-01-07 (×2): qty 1
  Filled 2019-01-07: qty 2

## 2019-01-07 MED ORDER — FOLIC ACID 1 MG PO TABS
1.0000 mg | ORAL_TABLET | Freq: Every day | ORAL | Status: DC
  Administered 2019-01-08 – 2019-01-12 (×5): 1 mg via ORAL
  Filled 2019-01-07 (×5): qty 1

## 2019-01-07 MED ORDER — IOHEXOL 300 MG/ML  SOLN
100.0000 mL | Freq: Once | INTRAMUSCULAR | Status: AC | PRN
  Administered 2019-01-07: 100 mL via INTRAVENOUS

## 2019-01-07 MED ORDER — THIAMINE HCL 100 MG/ML IJ SOLN: 100.0000 mg | Freq: Every day | INTRAMUSCULAR | Status: DC

## 2019-01-07 MED ORDER — PROMETHAZINE HCL 25 MG/ML IJ SOLN
25.0000 mg | Freq: Once | INTRAMUSCULAR | Status: AC
  Administered 2019-01-07: 25 mg via INTRAVENOUS
  Filled 2019-01-07: qty 1

## 2019-01-07 MED ORDER — OXYCODONE HCL 5 MG PO TABS
5.0000 mg | ORAL_TABLET | ORAL | Status: DC | PRN
  Administered 2019-01-07 – 2019-01-11 (×3): 5 mg via ORAL
  Filled 2019-01-07 (×3): qty 1

## 2019-01-07 MED ORDER — ONDANSETRON HCL 4 MG/2ML IJ SOLN
4.0000 mg | Freq: Four times a day (QID) | INTRAMUSCULAR | Status: DC | PRN
  Administered 2019-01-07: 4 mg via INTRAVENOUS
  Filled 2019-01-07: qty 2

## 2019-01-07 MED ORDER — SODIUM CHLORIDE 0.9 % IV SOLN
INTRAVENOUS | Status: DC | PRN
  Administered 2019-01-07: 1000 mL via INTRAVENOUS

## 2019-01-07 MED ORDER — MAGNESIUM SULFATE 2 GM/50ML IV SOLN
2.0000 g | Freq: Once | INTRAVENOUS | Status: AC
  Administered 2019-01-07: 2 g via INTRAVENOUS
  Filled 2019-01-07: qty 50

## 2019-01-07 MED ORDER — POTASSIUM CHLORIDE 10 MEQ/100ML IV SOLN
10.0000 meq | Freq: Once | INTRAVENOUS | Status: AC
  Administered 2019-01-07: 10 meq via INTRAVENOUS
  Filled 2019-01-07: qty 100

## 2019-01-07 MED ORDER — POTASSIUM CHLORIDE CRYS ER 20 MEQ PO TBCR
40.0000 meq | EXTENDED_RELEASE_TABLET | Freq: Once | ORAL | Status: AC
  Administered 2019-01-07: 40 meq via ORAL
  Filled 2019-01-07: qty 2

## 2019-01-07 MED ORDER — THIAMINE HCL 100 MG PO TABS
100.0000 mg | ORAL_TABLET | Freq: Every day | ORAL | Status: DC
  Administered 2019-01-08 – 2019-01-12 (×5): 100 mg via ORAL
  Filled 2019-01-07 (×5): qty 1

## 2019-01-07 MED ORDER — HEPARIN SODIUM (PORCINE) 5000 UNIT/ML IJ SOLN
5000.0000 [IU] | Freq: Three times a day (TID) | INTRAMUSCULAR | Status: DC
  Administered 2019-01-07 – 2019-01-12 (×12): 5000 [IU] via SUBCUTANEOUS
  Filled 2019-01-07 (×13): qty 1

## 2019-01-07 MED ORDER — SODIUM CHLORIDE 0.9% FLUSH: 3.0000 mL | Freq: Once | INTRAVENOUS | Status: DC

## 2019-01-07 MED ORDER — HYDROMORPHONE HCL 1 MG/ML IJ SOLN
1.0000 mg | Freq: Once | INTRAMUSCULAR | Status: AC
  Administered 2019-01-07: 1 mg via INTRAVENOUS
  Filled 2019-01-07: qty 1

## 2019-01-07 MED ORDER — CHLORDIAZEPOXIDE HCL 25 MG PO CAPS
25.0000 mg | ORAL_CAPSULE | Freq: Three times a day (TID) | ORAL | Status: DC
  Administered 2019-01-07 – 2019-01-11 (×12): 25 mg via ORAL
  Filled 2019-01-07 (×12): qty 1

## 2019-01-07 MED ORDER — SODIUM CHLORIDE (PF) 0.9 % IJ SOLN
INTRAMUSCULAR | Status: AC
  Filled 2019-01-07: qty 50

## 2019-01-07 MED ORDER — FOLIC ACID 5 MG/ML IJ SOLN: 1.0000 mg | Freq: Every day | INTRAMUSCULAR | Status: DC

## 2019-01-07 MED ORDER — THIAMINE HCL 100 MG/ML IJ SOLN
Freq: Once | INTRAVENOUS | Status: AC
  Filled 2019-01-07: qty 1000

## 2019-01-07 MED ORDER — LORAZEPAM 2 MG/ML IJ SOLN
0.0000 mg | Freq: Four times a day (QID) | INTRAMUSCULAR | Status: AC
  Administered 2019-01-07: 1 mg via INTRAVENOUS
  Administered 2019-01-08: 4 mg via INTRAVENOUS
  Administered 2019-01-08 (×2): 2 mg via INTRAVENOUS
  Administered 2019-01-09: 3 mg via INTRAVENOUS
  Administered 2019-01-09: 4 mg via INTRAVENOUS
  Filled 2019-01-07 (×3): qty 1
  Filled 2019-01-07: qty 2
  Filled 2019-01-07 (×2): qty 1
  Filled 2019-01-07: qty 2
  Filled 2019-01-07: qty 1

## 2019-01-07 MED ORDER — SODIUM CHLORIDE 0.9 % IV SOLN: INTRAVENOUS | Status: DC

## 2019-01-07 MED ORDER — LORAZEPAM 2 MG/ML IJ SOLN
0.0000 mg | Freq: Two times a day (BID) | INTRAMUSCULAR | Status: AC
  Administered 2019-01-09: 3 mg via INTRAVENOUS
  Administered 2019-01-10 (×2): 2 mg via INTRAVENOUS
  Administered 2019-01-11: 4 mg via INTRAVENOUS
  Filled 2019-01-07 (×2): qty 2
  Filled 2019-01-07 (×2): qty 1
  Filled 2019-01-07: qty 2

## 2019-01-07 MED ORDER — ONDANSETRON HCL 4 MG PO TABS: 4.0000 mg | ORAL_TABLET | Freq: Four times a day (QID) | ORAL | Status: DC | PRN

## 2019-01-07 MED ORDER — METOPROLOL TARTRATE 25 MG PO TABS
25.0000 mg | ORAL_TABLET | Freq: Two times a day (BID) | ORAL | Status: DC
  Administered 2019-01-07 – 2019-01-12 (×11): 25 mg via ORAL
  Filled 2019-01-07 (×3): qty 1
  Filled 2019-01-07: qty 2
  Filled 2019-01-07 (×3): qty 1
  Filled 2019-01-07: qty 2
  Filled 2019-01-07 (×3): qty 1

## 2019-01-07 MED ORDER — SODIUM CHLORIDE 0.9 % IV BOLUS
1000.0000 mL | Freq: Once | INTRAVENOUS | Status: AC
  Administered 2019-01-07: 1000 mL via INTRAVENOUS

## 2019-01-07 MED ORDER — MORPHINE SULFATE (PF) 4 MG/ML IV SOLN
4.0000 mg | Freq: Once | INTRAVENOUS | Status: AC
  Administered 2019-01-07: 4 mg via INTRAVENOUS
  Filled 2019-01-07: qty 1

## 2019-01-07 MED ORDER — HYDROMORPHONE HCL 1 MG/ML IJ SOLN
0.5000 mg | INTRAMUSCULAR | Status: DC | PRN
  Administered 2019-01-07 – 2019-01-08 (×5): 0.5 mg via INTRAVENOUS
  Filled 2019-01-07 (×5): qty 0.5

## 2019-01-07 NOTE — ED Triage Notes (Signed)
Pt reports vomiting for the last 7 hr. Pt actively vomiting at time of triage.

## 2019-01-07 NOTE — ED Provider Notes (Addendum)
Care assumed from C. Lawyer PA-C at shift change pending results of CMP, lipase, CT A/P.  See his note for full H&P.   Briefly this is a 49 year old female presenting with nausea and vomiting after drinking alcohol today. She admits to drinking alcohol daily. Plan is to follow up on results, PO challenge, and disposition accordingly. Work up by previous provider includes unremarkable CBC and UA.   Physical Exam  BP (!) 161/98   Pulse 80   Temp 98 F (36.7 C) (Oral)   Resp 16   Ht 5\' 7"  (1.702 m)   Wt 56.7 kg   SpO2 100%   BMI 19.58 kg/m   Physical Exam  PE: Constitutional: well-developed, well-nourished, no apparent distress HENT: normocephalic, atraumatic. no cervical adenopathy Cardiovascular: normal rate and rhythm, distal pulses intact Pulmonary/Chest: effort normal; breath sounds clear and equal bilaterally; no wheezes or rales Abdominal: soft. Generalized tenderness worse in epigastric area. No rebound or guarding. peritoneal signs. Musculoskeletal: full ROM, no edema Neurological: alert with goal directed thinking Skin: warm and dry, no rash, no diaphoresis Psychiatric: normal mood and affect, normal behavior    ED Course/Procedures   Results for orders placed or performed during the hospital encounter of 01/07/19 (from the past 24 hour(s))  Comprehensive metabolic panel     Status: Abnormal   Collection Time: 01/07/19  6:19 AM  Result Value Ref Range   Sodium 138 135 - 145 mmol/L   Potassium 2.9 (L) 3.5 - 5.1 mmol/L   Chloride 95 (L) 98 - 111 mmol/L   CO2 25 22 - 32 mmol/L   Glucose, Bld 124 (H) 70 - 99 mg/dL   BUN 7 6 - 20 mg/dL   Creatinine, Ser 03/07/19 0.44 - 1.00 mg/dL   Calcium 8.5 (L) 8.9 - 10.3 mg/dL   Total Protein 7.5 6.5 - 8.1 g/dL   Albumin 4.0 3.5 - 5.0 g/dL   AST 5.27 (H) 15 - 41 U/L   ALT 82 (H) 0 - 44 U/L   Alkaline Phosphatase 154 (H) 38 - 126 U/L   Total Bilirubin 2.4 (H) 0.3 - 1.2 mg/dL   GFR calc non Af Amer >60 >60 mL/min   GFR calc Af Amer  >60 >60 mL/min   Anion gap 18 (H) 5 - 15  CBC     Status: Abnormal   Collection Time: 01/07/19  6:19 AM  Result Value Ref Range   WBC 9.3 4.0 - 10.5 K/uL   RBC 3.62 (L) 3.87 - 5.11 MIL/uL   Hemoglobin 13.9 12.0 - 15.0 g/dL   HCT 03/07/19 42.3 - 53.6 %   MCV 111.0 (H) 80.0 - 100.0 fL   MCH 38.4 (H) 26.0 - 34.0 pg   MCHC 34.6 30.0 - 36.0 g/dL   RDW 14.4 31.5 - 40.0 %   Platelets 223 150 - 400 K/uL   nRBC 0.0 0.0 - 0.2 %  Urinalysis, Routine w reflex microscopic     Status: Abnormal   Collection Time: 01/07/19  6:19 AM  Result Value Ref Range   Color, Urine YELLOW YELLOW   APPearance CLEAR CLEAR   Specific Gravity, Urine 1.018 1.005 - 1.030   pH 6.0 5.0 - 8.0   Glucose, UA NEGATIVE NEGATIVE mg/dL   Hgb urine dipstick NEGATIVE NEGATIVE   Bilirubin Urine NEGATIVE NEGATIVE   Ketones, ur 80 (A) NEGATIVE mg/dL   Protein, ur 30 (A) NEGATIVE mg/dL   Nitrite NEGATIVE NEGATIVE   Leukocytes,Ua NEGATIVE NEGATIVE   RBC / HPF  0-5 0 - 5 RBC/hpf   WBC, UA 0-5 0 - 5 WBC/hpf   Bacteria, UA NONE SEEN NONE SEEN   Squamous Epithelial / LPF 0-5 0 - 5   Mucus PRESENT   Ethanol     Status: Abnormal   Collection Time: 01/07/19  6:19 AM  Result Value Ref Range   Alcohol, Ethyl (B) 63 (H) <10 mg/dL  I-Stat beta hCG blood, ED     Status: None   Collection Time: 01/07/19  6:26 AM  Result Value Ref Range   I-stat hCG, quantitative <5.0 <5 mIU/mL   Comment 3            EKG Interpretation  Date/Time:  Saturday January 07 2019 08:04:21 EST Ventricular Rate:  104 PR Interval:    QRS Duration: 79 QT Interval:  423 QTC Calculation: 557 R Axis:   81 Text Interpretation: Sinus tachycardia Left atrial enlargement Consider anterior infarct Borderline T abnormalities, inferior leads Prolonged QT interval No STEMI Confirmed by Alona Bene [phone]) on 01/07/2019 8:13:07 AM        CT ABDOMEN AND PELVIS WITH CONTRAST    TECHNIQUE:  Multidetector CT imaging of the abdomen and pelvis was performed  using the  standard protocol following bolus administration of  intravenous contrast.    CONTRAST: OMNIPAQUE IOHEXOL 300 MG/ML SOLN    COMPARISON: No pertinent prior studies available for comparison.    FINDINGS:  LOWER CHEST: The imaged lungs are clear.The visualized heart is  normal in size.    HEPATOBILIARY: Severe hepatic steatosis with hepatomegaly and  regions of focal fatty sparing. No appreciable biliary ductal  dilatation.    PANCREAS: The pancreas is edematous with surrounding peripancreatic  inflammatory stranding and edema. Edema and fluid extends into the  along the transverse and ascending colon, into the mesentery and  anterior pararenal space. No organized peripancreatic collection is  identified. No pancreatic necrosis is identified. No pancreatic  ductal dilatation.    SPLEEN: No focal lesion.No splenomegaly.    ADRENALS/URINARY TRACT:    No adrenal gland mass.    Symmetric renal enhancement. 4 mm hypodense lesion within the lower  pole of the right kidney too small to characterize. No  hydronephrosis. The bladder is unremarkable for the degree of  distension    STOMACH/BOWEL: Edema surrounding the stomach. The stomach otherwise  unremarkable. No dilated loops of bowel or bowel wall thickening.  Normal appendix.    VASCULAR/LYMPHATIC:    No abdominal aortic aneurysm. Minimal aortic atherosclerosis.    The IVC, portal vein, SMV and splenic vein are patent.    No lymphadenopathy.    REPRODUCTIVE: Incompletely assessed by CT modality.No pathologic  findings. Intrauterine device.    OTHER: Small volume pelvic free fluid.The body wall is normal.    MUSCULOSKELETAL: No acute bony abnormality.    IMPRESSION:  Findings consistent with acute pancreatitis. Prominent  peripancreatic stranding and fluid, with fluid extending along the  stomach, transverse and ascending colon and into the anterior  pararenal space. No organized  peripancreatic collection is  demonstrated on the current examination. No pancreatic necrosis.    Severe hepatic steatosis and hepatomegaly with regions of focal  fatty sparing.      Electronically Signed  By: Jackey Loge DO  On: 01/07/2019 08:24   .Critical Care Performed by: Sherene Sires, PA-C Authorized by: Sherene Sires, PA-C   Critical care provider statement:    Critical care time (minutes):  35   Critical care was  necessary to treat or prevent imminent or life-threatening deterioration of the following conditions:  Metabolic crisis   Critical care was time spent personally by me on the following activities:  Blood draw for specimens, development of treatment plan with patient or surrogate, discussions with consultants, evaluation of patient's response to treatment, obtaining history from patient or surrogate, review of old charts, re-evaluation of patient's condition, pulse oximetry, ordering and review of radiographic studies, ordering and performing treatments and interventions and ordering and review of laboratory studies   I assumed direction of critical care for this patient from another provider in my specialty: yes       MDM  Received in signout pending labs and CT abdomen pelvis. She has received IVF, morphine and phenergan for her symptoms.   Work-up shows CBC without leukocytosis, stable hemoglobin 13.9.  Ethanol is 63.  Beta hCG is negative.  UA was without signs of infection, does show 80 ketones, likely dehydration as she has had multiple episodes of emesis. Lipase elevated 569. CMP with multiple abnormalities including hypokalemia of 2.9, elevated liver enzymes with AST and ALT of 155 and 82, total bilirubin is 2.4, also with elevated anion gap of 18.  Will replace potassium with IV and p.o. EKG shows prolonged QTC of 557, hold off on further QT prolonging medications. Will give dilaudid for pain. If she continues to have nausea and emesis will  consider ativan, being careful to avoid use with benzos. CT A/P distant with acute pancreatitis. Radiologist also comments on prominent peripancreatic stranding and fluid, with fluid extending along the stomach, transverse and ascending colon and into the anterior pararenal space. She denies history of pancreatitis. Findings and plan of care discussed with supervising physician Dr. Laverta Baltimore who agrees with plan to admit. Spoke with Dr. Marylyn Ishihara with hospitalist service who agrees to assume care of patient and bring into the hospital for further evaluation and management.      Portions of this note were generated with Lobby[employer]. Dictation errors may occur despite best attempts at proofreading.    Cherre Robins, PA-C 01/07/19 0859    Cherre Robins, PA-C 01/07/19 0901    Margette Fast, MD 01/07/19 2045

## 2019-01-07 NOTE — Progress Notes (Signed)
Pt arrived from ED to room 1514. BP 194/111. Informed the ED during report to retake the VS and the last BP was 176/114. Attempted to call ED to ask if the pt could be given any medications to resolve this but never got through to anyone. Writer will administer prn Hydralazine. Will continue to monitor.

## 2019-01-07 NOTE — H&P (Signed)
.  History and Physical    Tammy Tran RSW:[id number] DOB: 1970/06/06 DOA: 01/07/2019  PCP: Patient, No Pcp Per  Patient coming from: Home   Chief Complaint: ab pain  HPI: Tammy Tran is a 49 y.o. female with medical history significant of EtOH abuse. Reports with 3 days of nausea that has worsened to epigastric and LUQ abdominal pain. She reports that a few days ago, she would feel nauseous through out the day; especially with eating. However, she could tolerate it for the most part. She did not try any OTCs or other meds to help. She was able to continue drinking her alcohol. She reports that yesterday when she tried to eat and drink, she had pain in the epigastric area and felt severely nauseous. She states she "pulled an all-nighter" with nausea and vomiting. She became concerned and sought help in the ED.   ED Course: CT revealed acute pancreatitis. [facility] was called for admission.   Review of Systems: Denies CP, dyspnea, HA, F, respiratory symptoms, sick contacts, seizure-like activity. Reports N, V, ab pain, "jitteriness". Remainder of 10 point review of systems is otherwise negative for all not mentioned in HPI.    History reviewed. No pertinent past medical history.  Past Surgical History:  Procedure Laterality Date  . CESAREAN SECTION       reports that she has been smoking cigarettes. She has been smoking about 0.25 packs per day. She has never used smokeless tobacco. She reports that she does not drink alcohol or use drugs.  No Known Allergies  History reviewed. No pertinent family history.  Prior to Admission medications   Not on File    Physical Exam: Vitals:   01/07/19 0512 01/07/19 0526 01/07/19 0618 01/07/19 0812  BP: (!) 167/97  (!) 161/98 (!) 180/105  Pulse: 69  80 81  Resp: 17  16 16   Temp: 98 F (36.7 C)     TempSrc: Oral     SpO2: 100%  100% 100%  Weight:  56.7 kg    Height:  5\' 7"  (1.702 m)      Constitutional: 49 y.o. female NAD, calm,  comfortable Vitals:   01/07/19 0512 01/07/19 0526 01/07/19 0618 01/07/19 0812  BP: (!) 167/97  (!) 161/98 (!) 180/105  Pulse: 69  80 81  Resp: 17  16 16   Temp: 98 F (36.7 C)     TempSrc: Oral     SpO2: 100%  100% 100%  Weight:  56.7 kg    Height:  5\' 7"  (1.702 m)     General: 49 y.o. female resting in bed somewhat anxious Eyes: PERRL, normal sclera ENMT: Nares patent w/o discharge, orophaynx clear, dentition normal, ears w/o discharge/lesions/ulcers Cardiovascular: tachy, +S1, S2, no m/g/r, equal pulses throughout Respiratory: CTABL, no w/r/r, normal WOB GI: BS+, ND; TTP LLQ, LUQ, epigastric, no masses noted MSK: No e/c/c Skin: No rashes, bruises, ulcerations noted; multiple tattoos noted Neuro: A&O x 3, no focal deficits Psyc: Appropriate interaction and affect, anxious but cooperative   Labs on Admission: I have personally reviewed following labs and imaging studies  CBC: Recent Labs  Lab 01/07/19 0619  WBC 9.3  HGB 13.9  HCT 40.2  MCV 111.0*  PLT 223   Basic Metabolic Panel: Recent Labs  Lab 01/07/19 0619  NA 138  K 2.9*  CL 95*  CO2 25  GLUCOSE 124*  BUN 7  CREATININE 0.56  CALCIUM 8.5*   GFR: Estimated Creatinine Clearance: 78.7 mL/min (by C-G formula based  on SCr of 0.56 mg/dL). Liver Function Tests: Recent Labs  Lab 01/07/19 0619  AST 155*  ALT 82*  ALKPHOS 154*  BILITOT 2.4*  PROT 7.5  ALBUMIN 4.0   Recent Labs  Lab 01/07/19 0619  LIPASE 569*   No results for input(s): AMMONIA in the last 168 hours. Coagulation Profile: No results for input(s): INR, PROTIME in the last 168 hours. Cardiac Enzymes: No results for input(s): CKTOTAL, CKMB, CKMBINDEX, TROPONINI in the last 168 hours. BNP (last 3 results) No results for input(s): PROBNP in the last 8760 hours. HbA1C: No results for input(s): HGBA1C in the last 72 hours. CBG: No results for input(s): GLUCAP in the last 168 hours. Lipid Profile: No results for input(s): CHOL, HDL,  LDLCALC, TRIG, CHOLHDL, LDLDIRECT in the last 72 hours. Thyroid Function Tests: No results for input(s): TSH, T4TOTAL, FREET4, T3FREE, THYROIDAB in the last 72 hours. Anemia Panel: No results for input(s): VITAMINB12, FOLATE, FERRITIN, TIBC, IRON, RETICCTPCT in the last 72 hours. Urine analysis:    Component Value Date/Time   COLORURINE YELLOW 01/07/2019 0619   APPEARANCEUR CLEAR 01/07/2019 0619   LABSPEC 1.018 01/07/2019 0619   PHURINE 6.0 01/07/2019 0619   GLUCOSEU NEGATIVE 01/07/2019 0619   HGBUR NEGATIVE 01/07/2019 0619   BILIRUBINUR NEGATIVE 01/07/2019 0619   KETONESUR 80 (A) 01/07/2019 0619   PROTEINUR 30 (A) 01/07/2019 0619   NITRITE NEGATIVE 01/07/2019 0619   LEUKOCYTESUR NEGATIVE 01/07/2019 3267    Radiological Exams on Admission: CT Abdomen Pelvis W Contrast  Result Date: 01/07/2019 CLINICAL DATA:  Abdominal pain, unspecified diffuse abdominal pain worse in left upper quadrant. Severe nausea and vomiting. EXAM: CT ABDOMEN AND PELVIS WITH CONTRAST TECHNIQUE: Multidetector CT imaging of the abdomen and pelvis was performed using the standard protocol following bolus administration of intravenous contrast. CONTRAST:  OMNIPAQUE IOHEXOL 300 MG/ML  SOLN COMPARISON:  No pertinent prior studies available for comparison. FINDINGS: LOWER CHEST: The imaged lungs are clear.The visualized heart is normal in size. HEPATOBILIARY: Severe hepatic steatosis with hepatomegaly and regions of focal fatty sparing. No appreciable biliary ductal dilatation. PANCREAS: The pancreas is edematous with surrounding peripancreatic inflammatory stranding and edema. Edema and fluid extends into the along the transverse and ascending colon, into the mesentery and anterior pararenal space. No organized peripancreatic collection is identified. No pancreatic necrosis is identified. No pancreatic ductal dilatation. SPLEEN: No focal lesion.No splenomegaly. ADRENALS/URINARY TRACT: No adrenal gland mass. Symmetric  renal enhancement. 4 mm hypodense lesion within the lower pole of the right kidney too small to characterize. No hydronephrosis. The bladder is unremarkable for the degree of distension STOMACH/BOWEL: Edema surrounding the stomach. The stomach otherwise unremarkable. No dilated loops of bowel or bowel wall thickening. Normal appendix. VASCULAR/LYMPHATIC: No abdominal aortic aneurysm.  Minimal aortic atherosclerosis. The IVC, portal vein, SMV and splenic vein are patent. No lymphadenopathy. REPRODUCTIVE: Incompletely assessed by CT modality.No pathologic findings. Intrauterine device. OTHER: Small volume pelvic free fluid.The body wall is normal. MUSCULOSKELETAL: No acute bony abnormality. IMPRESSION: Findings consistent with acute pancreatitis. Prominent peripancreatic stranding and fluid, with fluid extending along the stomach, transverse and ascending colon and into the anterior pararenal space. No organized peripancreatic collection is demonstrated on the current examination. No pancreatic necrosis. Severe hepatic steatosis and hepatomegaly with regions of focal fatty sparing. Electronically Signed   By: Jackey Loge DO   On: 01/07/2019 08:24    EKG: Independently reviewed. Sinus tach.  Assessment/Plan Principal Problem:   Acute pancreatitis Active Problems:   ETOH abuse  Alcohol withdrawal (HCC)   HTN (hypertension)   Elevated LFTs   Macrocytosis   Hypokalemia   Tobacco abuse    Acute Pancreatitis     - Ranson is 0     - reports that this is first episode of pancreatitis     - most likely EtOH is etiology     - no bilary duct dilation seen on CT     - no lipid panel ordered; will order     - continue fluids, will give banana bag as well     - May have sips of water, ice, meds; otherwise, NPO     - inpt, med-tele  EtOH abuse EtOH withdrawal     - drinks daily; "one large cup... I like vodka"     - reports that she will wake up fine, but by the end of the workday, she will have the  shakes if she does not have any alcohol     - she denies a Hx of withdrawal seizures     - HTN more likely related to withdrawal     - librium 25mg  TID; CIWA protocol, telemetry bed, banana bag, folate/thiamine daily     -  Check Mg2+, B12, B6     - counsled against further EtOH use  HTN     - likely related to above     - PRN hydralazine  Hypokalemia     - replete, check Mg2+  Elevated LFTs     - hepatic steatosis and hepatomegaly seen on CT     - check hepatitis panel, PT-INR     - follow labs  Macrocytosis     - check B12, Folate, TSH  Tobacco abuse     - counseled against further use     - she states she is stopping today     - started smoking a few months ago d/t family stress  DVT prophylaxis: heparin  Code Status: FULL  Family Communication: None at bedside  Disposition Plan: TBD  Consults called: None  Admission status: Inpatient. Need for IVF, IV pain control.     Jonnie Finner DO [facility]  If 7PM-7AM, please contact night-coverage [email or web address]  01/07/2019, 9:26 AM

## 2019-01-07 NOTE — ED Provider Notes (Signed)
[facility]-EMERGENCY DEPT Provider Note   CSN: [id number] Arrival date & time: 01/07/19  8527     History Chief Complaint  Patient presents with  . Vomiting    Tammy Tran is a 49 y.o. female.  HPI Patient presents to the emergency department with vomiting since around 6 PM.  The patient states she had some alcohol prior to the episode starting.  Patient states she had some alcohol but has not eaten since 2 PM.  The patient states nothing seems to make condition better or worse.  Patient states she took some Goody's powder but feels like she vomited shortly thereafter.  The patient denies chest pain, shortness of breath, headache,blurred vision, neck pain, fever, cough, weakness, numbness, dizziness, anorexia, edema, , diarrhea, rash, back pain, dysuria, hematemesis, bloody stool, near syncope, or syncope.    History reviewed. No pertinent past medical history.  There are no problems to display for this patient.   Past Surgical History:  Procedure Laterality Date  . CESAREAN SECTION       OB History   No obstetric history on file.     History reviewed. No pertinent family history.  Social History   Tobacco Use  . Smoking status: Current Every Day Smoker    Packs/day: 0.25    Types: Cigarettes  . Smokeless tobacco: Never Used  Substance Use Topics  . Alcohol use: Never  . Drug use: Never    Home Medications Prior to Admission medications   Not on File    Allergies    Patient has no known allergies.  Review of Systems   Review of Systems All other systems negative except as documented in the HPI. All pertinent positives and negatives as reviewed in the HPI. Physical Exam Updated Vital Signs BP (!) 167/97   Pulse 69   Temp 98 F (36.7 C) (Oral)   Resp 17   Ht 5\' 7"  (1.702 m)   Wt 56.7 kg   SpO2 100%   BMI 19.58 kg/m   Physical Exam Vitals and nursing note reviewed.  Constitutional:      General: She is not in acute  distress.    Appearance: She is well-developed.  HENT:     Head: Normocephalic and atraumatic.  Eyes:     Pupils: Pupils are equal, round, and reactive to light.  Cardiovascular:     Rate and Rhythm: Normal rate and regular rhythm.     Heart sounds: Normal heart sounds. No murmur. No friction rub. No gallop.   Pulmonary:     Effort: Pulmonary effort is normal. No respiratory distress.     Breath sounds: Normal breath sounds. No wheezing.  Abdominal:     General: Bowel sounds are normal. There is no distension.     Palpations: Abdomen is soft.     Tenderness: There is abdominal tenderness. There is no guarding.  Musculoskeletal:     Cervical back: Normal range of motion and neck supple.  Skin:    General: Skin is warm and dry.     Capillary Refill: Capillary refill takes less than 2 seconds.     Findings: No erythema or rash.  Neurological:     Mental Status: She is alert and oriented to person, place, and time.     Motor: No abnormal muscle tone.     Coordination: Coordination normal.  Psychiatric:        Behavior: Behavior normal.     ED Results / Procedures / Treatments  Labs (all labs ordered are listed, but only abnormal results are displayed) Labs Reviewed  LIPASE, BLOOD  COMPREHENSIVE METABOLIC PANEL  CBC  URINALYSIS, ROUTINE W REFLEX MICROSCOPIC  ETHANOL  I-STAT BETA HCG BLOOD, ED ([facility], [facility], [facility] ONLY)    EKG None  Radiology No results found.  Procedures Procedures (including critical care time)  Medications Ordered in ED Medications  sodium chloride flush (NS) 0.9 % injection 3 mL (has no administration in time range)  sodium chloride 0.9 % bolus 1,000 mL (has no administration in time range)  morphine 4 MG/ML injection 4 mg (has no administration in time range)  promethazine (PHENERGAN) injection 25 mg (has no administration in time range)    ED Course  I have reviewed the triage vital signs and the nursing notes.  Pertinent labs & imaging results  that were available during my care of the patient were reviewed by me and considered in my medical decision making (see chart for details).    MDM Rules/Calculators/A&P                      Patient will need CT scan imaging to further assess her mid abdominal pain.  Patient was given IV fluids and Phenergan.  I advised the patient of the plan thus far and all questions were answered. Final Clinical Impression(s) / ED Diagnoses Final diagnoses:  None    Rx / DC Orders ED Discharge Orders    None       Dalia Heading, PA-C 01/07/19 8921    Shanon Rosser, MD 01/07/19 [phone]

## 2019-01-08 LAB — COMPREHENSIVE METABOLIC PANEL
ALT: 53 U/L — ABNORMAL HIGH (ref 0–44)
AST: 80 U/L — ABNORMAL HIGH (ref 15–41)
Albumin: 3.2 g/dL — ABNORMAL LOW (ref 3.5–5.0)
Alkaline Phosphatase: 112 U/L (ref 38–126)
Anion gap: 11 (ref 5–15)
BUN: 10 mg/dL (ref 6–20)
CO2: 28 mmol/L (ref 22–32)
Calcium: 7.5 mg/dL — ABNORMAL LOW (ref 8.9–10.3)
Chloride: 96 mmol/L — ABNORMAL LOW (ref 98–111)
Creatinine, Ser: 0.47 mg/dL (ref 0.44–1.00)
GFR calc Af Amer: >60 mL/min (ref >60)
GFR calc non Af Amer: >60 mL/min (ref >60)
Glucose, Bld: 85 mg/dL (ref 70–99)
Potassium: 3.1 mmol/L — ABNORMAL LOW (ref 3.5–5.1)
Sodium: 135 mmol/L (ref 135–145)
Total Bilirubin: 3.3 mg/dL — ABNORMAL HIGH (ref 0.3–1.2)
Total Protein: 6.4 g/dL — ABNORMAL LOW (ref 6.5–8.1)

## 2019-01-08 LAB — MAGNESIUM: Magnesium: 1.8 mg/dL (ref 1.7–2.4)

## 2019-01-08 LAB — HEPATITIS PANEL, ACUTE
HCV Ab: NONREACTIVE
Hep A IgM: NONREACTIVE
Hep B C IgM: NONREACTIVE
Hepatitis B Surface Ag: NONREACTIVE

## 2019-01-08 LAB — CBC
HCT: 38.1 % (ref 36.0–46.0)
Hemoglobin: 13 g/dL (ref 12.0–15.0)
MCH: 38.7 pg — ABNORMAL HIGH (ref 26.0–34.0)
MCHC: 34.1 g/dL (ref 30.0–36.0)
MCV: 113.4 fL — ABNORMAL HIGH (ref 80.0–100.0)
Platelets: 160 10*3/uL (ref 150–400)
RBC: 3.36 MIL/uL — ABNORMAL LOW (ref 3.87–5.11)
RDW: 13.3 % (ref 11.5–15.5)
WBC: 12.1 10*3/uL — ABNORMAL HIGH (ref 4.0–10.5)
nRBC: 0 % (ref 0.0–0.2)

## 2019-01-08 LAB — FOLATE RBC
Folate, Hemolysate: 204 ng/mL
Folate, RBC: 516 ng/mL (ref >498)
Hematocrit: 39.5 % (ref 34.0–46.6)

## 2019-01-08 LAB — PROTIME-INR
INR: 1.2 (ref 0.8–1.2)
Prothrombin Time: 14.9 seconds (ref 11.4–15.2)

## 2019-01-08 MED ORDER — POTASSIUM CHLORIDE CRYS ER 20 MEQ PO TBCR
40.0000 meq | EXTENDED_RELEASE_TABLET | Freq: Two times a day (BID) | ORAL | Status: AC
  Administered 2019-01-08 (×2): 40 meq via ORAL
  Filled 2019-01-08 (×2): qty 2

## 2019-01-08 MED ORDER — NICOTINE 14 MG/24HR TD PT24
14.0000 mg | MEDICATED_PATCH | TRANSDERMAL | Status: DC
  Administered 2019-01-08 – 2019-01-11 (×4): 14 mg via TRANSDERMAL
  Filled 2019-01-08 (×4): qty 1

## 2019-01-08 MED ORDER — LORAZEPAM 2 MG/ML IJ SOLN
2.0000 mg | Freq: Once | INTRAMUSCULAR | Status: AC
  Administered 2019-01-08: 2 mg via INTRAVENOUS
  Filled 2019-01-08: qty 1

## 2019-01-08 MED ORDER — LORAZEPAM 2 MG/ML IJ SOLN
2.0000 mg | INTRAMUSCULAR | Status: AC
  Administered 2019-01-08: 2 mg via INTRAVENOUS
  Filled 2019-01-08: qty 1

## 2019-01-08 MED ORDER — LORAZEPAM 2 MG/ML IJ SOLN
2.0000 mg | Freq: Once | INTRAMUSCULAR | Status: AC
  Administered 2019-01-08: 2 mg via INTRAVENOUS

## 2019-01-08 NOTE — Progress Notes (Signed)
Pt's husband has permission to stay the night b/c he seems to help keep the pt calm and helps her with her withdrawal s/s.

## 2019-01-08 NOTE — Progress Notes (Signed)
   01/08/19 1955  MEWS Score  Pulse Rate (!) 135  BP (!) 134/101  MEWS Score  MEWS RR 1  MEWS Pulse 3  MEWS Systolic 0  MEWS LOC 0  MEWS Temp 0  MEWS Score 4  MEWS Score Color Red  MEWS Assessment  Is this an acute change? Yes  MEWS guidelines implemented *See Row Information* Red  Rapid Response Notification  Name of Rapid Response RN Notified Britta Mccreedy   Date Rapid Response Notified 01/08/19  Time Rapid Response Notified 2021  Provider Notification  Provider Name/Title Welton Flakes MD   Date Provider Notified 01/08/19  Time Provider Notified 2010  Notification Type Page  Notification Reason Change in status  Response No new orders (CIWA protocol followed )  Date of Provider Response 01/08/19  Time of Provider Response 2010  RED MEWS initiated. Pt experiencing ETOH withdrawal symptoms, CIWA protocol followed. Medications given, see MAR.  MD will be updated in 1 hour on pts response to interventions. Pt husband at bedside, bed alarm on.

## 2019-01-08 NOTE — Progress Notes (Signed)
PROGRESS NOTE    Tammy Tran  SEG:[id number] DOB: 31-Jan-1970 DOA: 01/07/2019 PCP: Patient, No Pcp Per    Brief Narrative:  49 y.o. female with medical history significant of EtOH abuse. Reports with 3 days of nausea that has worsened to epigastric and LUQ abdominal pain. She reports that a few days ago, she would feel nauseous through out the day; especially with eating. However, she could tolerate it for the most part. She did not try any OTCs or other meds to help. She was able to continue drinking her alcohol. She reports that yesterday when she tried to eat and drink, she had pain in the epigastric area and felt severely nauseous. She states she "pulled an all-nighter" with nausea and vomiting. She became concerned and sought help in the ED.   ED Course: CT revealed acute pancreatitis. TR[facility] was called for admission  Assessment & Plan:   Principal Problem:   Acute pancreatitis Active Problems:   ETOH abuse   Alcohol withdrawal (HCC)   HTN (hypertension)   Elevated LFTs   Macrocytosis   Hypokalemia   Tobacco abuse   Acute Alcoholic Pancreatitis     - no bilary duct dilation seen on CT     - TG of 92     - Continued on IV fluids, Banana bag ordered at time of presentation     - Presenting lipase of 569. - This AM, pt reports feeling better and had requested diet. Have ordered clear liquids, would gradually advance as tolerated  EtOH abuse EtOH withdrawal     - drinks daily; "one large cup... I like vodka"     - reports that she will wake up fine, but by the end of the workday, she will have the shakes if she does not have any alcohol     - Continued on librium 25mg  TID; CIWA protocol, telemetry bed, banana bag, folate/thiamine daily     -  Continued on CIWA protocol. CIWA remains 13-14 this afternoon.  -Will cont to give additional doses of ativan as needed. If still no improvement despite multiple doses of ativan, can consider precedex at that time  HTN     - likely  related to above     - Continued on PRN hydralazine  Hypokalemia, hypomagnesemia     - potassium remains low. Have replaced -Mg corrected -Repeat lytes in AM  Elevated LFTs     - hepatic steatosis and hepatomegaly seen on CT     - hepatitis panel neg     - repeat LFT's in AM  Macrocytosis     - B12 190 -Folate pending -TSH 1.075  Tobacco abuse     - counseled against further use at time of presentation     - will order nicotine patch  DVT prophylaxis: Heparin subq Code Status: Full Family Communication: Pt in room,family not at bedside Disposition Plan: Uncertain at this time  Consultants:     Procedures:     Antimicrobials: Anti-infectives (From admission, onward)   None       Subjective: Unable to assess as pt was sedated this AM  Objective: Vitals:   01/08/19 0742 01/08/19 1017 01/08/19 1249 01/08/19 1539  BP: (!) 130/91 (!) 140/97 (!) 152/96 (!) 129/98  Pulse: (!) 101 (!) 109 (!) 105 96  Resp: 20 20 20  (!) 22  Temp:  98.6 F (37 C) 98.4 F (36.9 C)   TempSrc:  Oral Oral   SpO2: 100% 100% 99% 99%  Weight:  Height:        Intake/Output Summary (Last 24 hours) at 01/08/2019 1716 Last data filed at 01/08/2019 1530 Gross per 24 hour  Intake 236 ml  Output --  Net 236 ml   Filed Weights   01/07/19 0526  Weight: 56.7 kg    Examination:  General exam: Appears calm and comfortable  Respiratory system: Clear to auscultation. Respiratory effort normal. Cardiovascular system: S1 & S2 heard, Regular Gastrointestinal system: Abdomen is nondistended, soft and nontender. No organomegaly or masses felt. Normal bowel sounds heard. Central nervous system: asleep No focal neurological deficits. Extremities: Symmetric 5 x 5 power. Skin: No rashes, lesions  Psychiatry: unable to assess given current mentation   Data Reviewed: I have personally reviewed following labs and imaging studies  CBC: Recent Labs  Lab 01/07/19 0619 01/07/19 1228  01/08/19 0622  WBC 9.3 11.9* 12.1*  HGB 13.9 14.1 13.0  HCT 40.2 41.3 38.1  MCV 111.0* 112.8* 113.4*  PLT 223 215 956   Basic Metabolic Panel: Recent Labs  Lab 01/07/19 0619 01/07/19 1228 01/08/19 0622 01/08/19 0712  NA 138  --  135  --   K 2.9*  --  3.1*  --   CL 95*  --  96*  --   CO2 25  --  28  --   GLUCOSE 124*  --  85  --   BUN 7  --  10  --   CREATININE 0.56 0.53 0.47  --   CALCIUM 8.5*  --  7.5*  --   MG  --  1.0*  --  1.8   GFR: Estimated Creatinine Clearance: 78.7 mL/min (by C-G formula based on SCr of 0.47 mg/dL). Liver Function Tests: Recent Labs  Lab 01/07/19 0619 01/08/19 0622  AST 155* 80*  ALT 82* 53*  ALKPHOS 154* 112  BILITOT 2.4* 3.3*  PROT 7.5 6.4*  ALBUMIN 4.0 3.2*   Recent Labs  Lab 01/07/19 0619  LIPASE 569*   No results for input(s): AMMONIA in the last 168 hours. Coagulation Profile: Recent Labs  Lab 01/07/19 1227 01/08/19 0622  INR 1.1 1.2   Cardiac Enzymes: No results for input(s): CKTOTAL, CKMB, CKMBINDEX, TROPONINI in the last 168 hours. BNP (last 3 results) No results for input(s): PROBNP in the last 8760 hours. HbA1C: No results for input(s): HGBA1C in the last 72 hours. CBG: No results for input(s): GLUCAP in the last 168 hours. Lipid Profile: Recent Labs    01/07/19 1227  CHOL 244*  HDL 51  LDLCALC 175*  TRIG 92  CHOLHDL 4.8   Thyroid Function Tests: Recent Labs    01/07/19 1228  TSH 1.075  1.007   Anemia Panel: Recent Labs    01/07/19 1228  VITAMINB12 190   Sepsis Labs: No results for input(s): PROCALCITON, LATICACIDVEN in the last 168 hours.  Recent Results (from the past 240 hour(s))  SARS CORONAVIRUS 2 (TAT 6-24 HRS) Nasopharyngeal Nasopharyngeal Swab     Status: None   Collection Time: 01/07/19  8:34 AM   Specimen: Nasopharyngeal Swab  Result Value Ref Range Status   SARS Coronavirus 2 NEGATIVE NEGATIVE Final    Comment: (NOTE) SARS-CoV-2 target nucleic acids are NOT DETECTED. The  SARS-CoV-2 RNA is generally detectable in upper and lower respiratory specimens during the acute phase of infection. Negative results do not preclude SARS-CoV-2 infection, do not rule out co-infections with other pathogens, and should not be used as the sole basis for treatment or other patient management decisions.  Negative results must be combined with clinical observations, patient history, and epidemiological information. The expected result is Negative. Fact Sheet for Patients: HairSlick.no Fact Sheet for Healthcare Providers: quierodirigir.com This test is not yet approved or cleared by the [place] FDA and  has been authorized for detection and/or diagnosis of SARS-CoV-2 by FDA under an Emergency Use Authorization (EUA). This EUA will remain  in effect (meaning this test can be used) for the duration of the COVID-19 declaration under Section 56 4(b)(1) of the Act, 21 U.S.C. section 360bbb-3(b)(1), unless the authorization is terminated or revoked sooner. Performed at [facility], [address] [place], [place] [address]      Radiology Studies: CT Abdomen Pelvis W Contrast  Result Date: 01/07/2019 CLINICAL DATA:  Abdominal pain, unspecified diffuse abdominal pain worse in left upper quadrant. Severe nausea and vomiting. EXAM: CT ABDOMEN AND PELVIS WITH CONTRAST TECHNIQUE: Multidetector CT imaging of the abdomen and pelvis was performed using the standard protocol following bolus administration of intravenous contrast. CONTRAST:  OMNIPAQUE IOHEXOL 300 MG/ML  SOLN COMPARISON:  No pertinent prior studies available for comparison. FINDINGS: LOWER CHEST: The imaged lungs are clear.The visualized heart is normal in size. HEPATOBILIARY: Severe hepatic steatosis with hepatomegaly and regions of focal fatty sparing. No appreciable biliary ductal dilatation. PANCREAS: The pancreas is edematous with surrounding  peripancreatic inflammatory stranding and edema. Edema and fluid extends into the along the transverse and ascending colon, into the mesentery and anterior pararenal space. No organized peripancreatic collection is identified. No pancreatic necrosis is identified. No pancreatic ductal dilatation. SPLEEN: No focal lesion.No splenomegaly. ADRENALS/URINARY TRACT: No adrenal gland mass. Symmetric renal enhancement. 4 mm hypodense lesion within the lower pole of the right kidney too small to characterize. No hydronephrosis. The bladder is unremarkable for the degree of distension STOMACH/BOWEL: Edema surrounding the stomach. The stomach otherwise unremarkable. No dilated loops of bowel or bowel wall thickening. Normal appendix. VASCULAR/LYMPHATIC: No abdominal aortic aneurysm.  Minimal aortic atherosclerosis. The IVC, portal vein, SMV and splenic vein are patent. No lymphadenopathy. REPRODUCTIVE: Incompletely assessed by CT modality.No pathologic findings. Intrauterine device. OTHER: Small volume pelvic free fluid.The body wall is normal. MUSCULOSKELETAL: No acute bony abnormality. IMPRESSION: Findings consistent with acute pancreatitis. Prominent peripancreatic stranding and fluid, with fluid extending along the stomach, transverse and ascending colon and into the anterior pararenal space. No organized peripancreatic collection is demonstrated on the current examination. No pancreatic necrosis. Severe hepatic steatosis and hepatomegaly with regions of focal fatty sparing. Electronically Signed   By: Jackey Loge DO   On: 01/07/2019 08:24    Scheduled Meds: . chlordiazePOXIDE  25 mg Oral TID  . folic acid  1 mg Oral Daily  . heparin  5,000 Units Subcutaneous Q8H  . LORazepam  0-4 mg Intravenous Q6H   Followed by  . [START ON 01/09/2019] LORazepam  0-4 mg Intravenous Q12H  . LORazepam  2 mg Intravenous Once  . metoprolol tartrate  25 mg Oral BID  . multivitamin with minerals  1 tablet Oral Daily  . nicotine  14  mg Transdermal Q24H  . potassium chloride  40 mEq Oral BID  . thiamine  100 mg Oral Daily   Continuous Infusions: . sodium chloride 1,000 mL (01/07/19 0820)  . sodium chloride 75 mL/hr at 01/08/19 0513     LOS: 1 day   Rickey Barbara, MD [facility] Pager On Amion  If 7PM-7AM, please contact night-coverage 01/08/2019, 5:16 PM

## 2019-01-08 NOTE — Progress Notes (Addendum)
Paged Dr with update: Pt looks better post 4mg  Ativan admin circa 2000, early metoprolol & librium. VSS except low ST 110. Visible tremors still present but Pt is following commands.   Addendum: Pt has had around 30mg  Ativan since 0517 1/3. Dr aware, RR has assessed pt.

## 2019-01-08 NOTE — Progress Notes (Signed)
Pt most recent CIWA score is 14. The pt agitated and having more hallucinations this morning and trying to climb out of the bed. Alerted MD and was informed to administer Ativan 2 mg q15 minutes. Will continue to monitor closely.

## 2019-01-08 NOTE — Progress Notes (Signed)
   01/08/19 1955  CIWA-Ar  BP (!) 134/101  Pulse Rate (!) 135  Nausea and Vomiting 0  Tactile Disturbances 0  Tremor 6  Auditory Disturbances 1  Paroxysmal Sweats 3  Visual Disturbances 2  Anxiety 3  Headache, Fullness in Head 0  Agitation 4  Orientation and Clouding of Sensorium 1  CIWA-Ar Total 20  ON Call [facility] paged about pt CIWA score. MD stated to follow protocol and give pt 4mg  Ativan and RN will update in 1 hour. Pt husband at bedside, bed alarm on.

## 2019-01-09 DIAGNOSIS — R9431 Abnormal electrocardiogram [ECG] [EKG]: Secondary | ICD-10-CM

## 2019-01-09 LAB — CBC
HCT: 36.6 % (ref 36.0–46.0)
Hemoglobin: 12.3 g/dL (ref 12.0–15.0)
MCH: 38.1 pg — ABNORMAL HIGH (ref 26.0–34.0)
MCHC: 33.6 g/dL (ref 30.0–36.0)
MCV: 113.3 fL — ABNORMAL HIGH (ref 80.0–100.0)
Platelets: 154 10*3/uL (ref 150–400)
RBC: 3.23 MIL/uL — ABNORMAL LOW (ref 3.87–5.11)
RDW: 13.2 % (ref 11.5–15.5)
WBC: 13.8 10*3/uL — ABNORMAL HIGH (ref 4.0–10.5)
nRBC: 0 % (ref 0.0–0.2)

## 2019-01-09 LAB — COMPREHENSIVE METABOLIC PANEL
ALT: 41 U/L (ref 0–44)
AST: 73 U/L — ABNORMAL HIGH (ref 15–41)
Albumin: 3 g/dL — ABNORMAL LOW (ref 3.5–5.0)
Alkaline Phosphatase: 101 U/L (ref 38–126)
Anion gap: 9 (ref 5–15)
BUN: 10 mg/dL (ref 6–20)
CO2: 21 mmol/L — ABNORMAL LOW (ref 22–32)
Calcium: 7.8 mg/dL — ABNORMAL LOW (ref 8.9–10.3)
Chloride: 103 mmol/L (ref 98–111)
Creatinine, Ser: 0.46 mg/dL (ref 0.44–1.00)
GFR calc Af Amer: >60 mL/min (ref >60)
GFR calc non Af Amer: >60 mL/min (ref >60)
Glucose, Bld: 86 mg/dL (ref 70–99)
Potassium: 3.9 mmol/L (ref 3.5–5.1)
Sodium: 133 mmol/L — ABNORMAL LOW (ref 135–145)
Total Bilirubin: 3.7 mg/dL — ABNORMAL HIGH (ref 0.3–1.2)
Total Protein: 6 g/dL — ABNORMAL LOW (ref 6.5–8.1)

## 2019-01-09 LAB — LIPASE, BLOOD: Lipase: 66 U/L — ABNORMAL HIGH (ref 11–51)

## 2019-01-09 LAB — MAGNESIUM: Magnesium: 1.9 mg/dL (ref 1.7–2.4)

## 2019-01-09 MED ORDER — ZOLPIDEM TARTRATE 5 MG PO TABS
5.0000 mg | ORAL_TABLET | Freq: Once | ORAL | Status: AC
  Administered 2019-01-09: 5 mg via ORAL
  Filled 2019-01-09: qty 1

## 2019-01-09 MED ORDER — LORAZEPAM 2 MG/ML IJ SOLN
2.0000 mg | Freq: Once | INTRAMUSCULAR | Status: AC
  Administered 2019-01-09: 2 mg via INTRAVENOUS
  Filled 2019-01-09: qty 1

## 2019-01-09 NOTE — Progress Notes (Signed)
Rapid Response Event Note  CIWA protocol in place for ETOH withdrawal symptoms, Patient has received 28 mg of Ativan in a 24 hour period. Primary nurse notified Provider ( Dr. Welton Flakes). No new orders. Provider is aware that the patient is having mild tremors and is having hallucinations.       Plan of Care: Continue with CIWA, IV ativan per protocol and orders.   [facility] Tammy Staten MSN, RN-BC  [facility] ICU/Stepdown  [facility]
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
## 2019-01-09 NOTE — Progress Notes (Signed)
PROGRESS NOTE    Tammy Tran  KAJ:[id number] DOB: January 23, 1970 DOA: 01/07/2019 PCP: Patient, No Pcp Per    Brief Narrative:  49 y.o. female with medical history significant of EtOH abuse. Reports with 3 days of nausea that has worsened to epigastric and LUQ abdominal pain. She reports that a few days ago, she would feel nauseous through out the day; especially with eating. However, she could tolerate it for the most part. She did not try any OTCs or other meds to help. She was able to continue drinking her alcohol. She reports that yesterday when she tried to eat and drink, she had pain in the epigastric area and felt severely nauseous. She states she "pulled an all-nighter" with nausea and vomiting. She became concerned and sought help in the ED.   ED Course: CT revealed acute pancreatitis. [facility] was called for admission  Assessment & Plan:   Principal Problem:   Acute pancreatitis Active Problems:   ETOH abuse   Alcohol withdrawal (HCC)   HTN (hypertension)   Elevated LFTs   Macrocytosis   Hypokalemia   Tobacco abuse   Acute Alcoholic Pancreatitis     - no bilary duct dilation seen on CT     - TG of 92     - Continued on IV fluids, Banana bag ordered at time of presentation     - Presenting lipase of 569. -Lipase now normalized. Would advance diet as tolerated  EtOH abuse EtOH withdrawal     - drinks daily; "one large cup... I like vodka"     - reports that she will wake up fine, but by the end of the workday, she will have the shakes if she does not have any alcohol     - Continued on librium 25mg  TID; CIWA protocol, telemetry bed, banana bag, folate/thiamine daily     -  Continued on CIWA protocol. CIWA higher at 17.  -Continue to give additional doses of ativan as needed. If still no improvement despite multiple doses of ativan, can consider precedex at that time  HTN     - likely related to above     - Continued on PRN hydralazine as needed  Hypokalemia,  hypomagnesemia     - potassium remains low. Have replaced -Mg corrected -Repeat lytes in AM  Elevated LFTs     - hepatic steatosis and hepatomegaly seen on CT     - hepatitis panel neg     - repeat LFT's in AM  Macrocytosis     - B12 190 -Folate pending -TSH 1.075, stable  Tobacco abuse     - counseled against further use at time of presentation     - will order nicotine patch  DVT prophylaxis: Heparin subq Code Status: Full Family Communication: Pt in room,family not at bedside Disposition Plan: Uncertain at this time  Consultants:     Procedures:     Antimicrobials: Anti-infectives (From admission, onward)   None      Subjective: Difficult to assess given current mentation  Objective: Vitals:   01/09/19 0116 01/09/19 0506 01/09/19 0916 01/09/19 1320  BP: (!) 136/98 (!) 157/108 (!) 137/99 (!) 145/106  Pulse: (!) 110 (!) 110 (!) 101 93  Resp: 18 19 18 18   Temp: 99.1 F (37.3 C) 99.1 F (37.3 C) 99.4 F (37.4 C) 99.1 F (37.3 C)  TempSrc: Oral Oral Oral Oral  SpO2: 98% 97% 97% 100%  Weight:      Height:  Intake/Output Summary (Last 24 hours) at 01/09/2019 1643 Last data filed at 01/09/2019 0300 Gross per 24 hour  Intake 2100 ml  Output --  Net 2100 ml   Filed Weights   01/07/19 0526  Weight: 56.7 kg    Examination: General exam: Awake, laying in bed, in nad Respiratory system: Normal respiratory effort, no wheezing Cardiovascular system: regular rate, s1, s2 Gastrointestinal system: Soft, nondistended, positive BS Central nervous system: CN2-12 grossly intact, strength intact, shaking Extremities: Perfused, no clubbing Skin: Normal skin turgor, no notable skin lesions seen Psychiatry: difficult to assess given current mentation  Data Reviewed: I have personally reviewed following labs and imaging studies  CBC: Recent Labs  Lab 01/07/19 0619 01/07/19 1228 01/08/19 0622 01/09/19 0533  WBC 9.3 11.9* 12.1* 13.8*  HGB 13.9 14.1  13.0 12.3  HCT 40.2 41.3  39.5 38.1 36.6  MCV 111.0* 112.8* 113.4* 113.3*  PLT 223 215 160 740   Basic Metabolic Panel: Recent Labs  Lab 01/07/19 0619 01/07/19 1228 01/08/19 0622 01/08/19 0712 01/09/19 0533  NA 138  --  135  --  133*  K 2.9*  --  3.1*  --  3.9  CL 95*  --  96*  --  103  CO2 25  --  28  --  21*  GLUCOSE 124*  --  85  --  86  BUN 7  --  10  --  10  CREATININE 0.56 0.53 0.47  --  0.46  CALCIUM 8.5*  --  7.5*  --  7.8*  MG  --  1.0*  --  1.8 1.9   GFR: Estimated Creatinine Clearance: 78.7 mL/min (by C-G formula based on SCr of 0.46 mg/dL). Liver Function Tests: Recent Labs  Lab 01/07/19 0619 01/08/19 0622 01/09/19 0533  AST 155* 80* 73*  ALT 82* 53* 41  ALKPHOS 154* 112 101  BILITOT 2.4* 3.3* 3.7*  PROT 7.5 6.4* 6.0*  ALBUMIN 4.0 3.2* 3.0*   Recent Labs  Lab 01/07/19 0619 01/09/19 0533  LIPASE 569* 66*   No results for input(s): AMMONIA in the last 168 hours. Coagulation Profile: Recent Labs  Lab 01/07/19 1227 01/08/19 0622  INR 1.1 1.2   Cardiac Enzymes: No results for input(s): CKTOTAL, CKMB, CKMBINDEX, TROPONINI in the last 168 hours. BNP (last 3 results) No results for input(s): PROBNP in the last 8760 hours. HbA1C: No results for input(s): HGBA1C in the last 72 hours. CBG: No results for input(s): GLUCAP in the last 168 hours. Lipid Profile: Recent Labs    01/07/19 1227  CHOL 244*  HDL 51  LDLCALC 175*  TRIG 92  CHOLHDL 4.8   Thyroid Function Tests: Recent Labs    01/07/19 1228  TSH 1.075  1.007   Anemia Panel: Recent Labs    01/07/19 1228  VITAMINB12 190   Sepsis Labs: No results for input(s): PROCALCITON, LATICACIDVEN in the last 168 hours.  Recent Results (from the past 240 hour(s))  SARS CORONAVIRUS 2 (TAT 6-24 HRS) Nasopharyngeal Nasopharyngeal Swab     Status: None   Collection Time: 01/07/19  8:34 AM   Specimen: Nasopharyngeal Swab  Result Value Ref Range Status   SARS Coronavirus 2 NEGATIVE NEGATIVE  Final    Comment: (NOTE) SARS-CoV-2 target nucleic acids are NOT DETECTED. The SARS-CoV-2 RNA is generally detectable in upper and lower respiratory specimens during the acute phase of infection. Negative results do not preclude SARS-CoV-2 infection, do not rule out co-infections with other pathogens, and should not be  used as the sole basis for treatment or other patient management decisions. Negative results must be combined with clinical observations, patient history, and epidemiological information. The expected result is Negative. Fact Sheet for Patients: HairSlick.no Fact Sheet for Healthcare Providers: quierodirigir.com This test is not yet approved or cleared by the [place] FDA and  has been authorized for detection and/or diagnosis of SARS-CoV-2 by FDA under an Emergency Use Authorization (EUA). This EUA will remain  in effect (meaning this test can be used) for the duration of the COVID-19 declaration under Section 56 4(b)(1) of the Act, 21 U.S.C. section 360bbb-3(b)(1), unless the authorization is terminated or revoked sooner. Performed at [facility], [address] [place], [place] [address]      Radiology Studies: No results found.  Scheduled Meds: . chlordiazePOXIDE  25 mg Oral TID  . folic acid  1 mg Oral Daily  . heparin  5,000 Units Subcutaneous Q8H  . LORazepam  0-4 mg Intravenous Q12H  . metoprolol tartrate  25 mg Oral BID  . multivitamin with minerals  1 tablet Oral Daily  . nicotine  14 mg Transdermal Q24H  . thiamine  100 mg Oral Daily   Continuous Infusions: . sodium chloride 1,000 mL (01/07/19 0820)  . sodium chloride 75 mL/hr at 01/09/19 0300     LOS: 2 days   Rickey Barbara, MD [facility] Pager On Amion  If 7PM-7AM, please contact night-coverage 01/09/2019, 4:43 PM

## 2019-01-10 DIAGNOSIS — R112 Nausea with vomiting, unspecified: Secondary | ICD-10-CM

## 2019-01-10 LAB — CBC
HCT: 32.9 % — ABNORMAL LOW (ref 36.0–46.0)
Hemoglobin: 11.2 g/dL — ABNORMAL LOW (ref 12.0–15.0)
MCH: 38.8 pg — ABNORMAL HIGH (ref 26.0–34.0)
MCHC: 34 g/dL (ref 30.0–36.0)
MCV: 113.8 fL — ABNORMAL HIGH (ref 80.0–100.0)
Platelets: 144 10*3/uL — ABNORMAL LOW (ref 150–400)
RBC: 2.89 MIL/uL — ABNORMAL LOW (ref 3.87–5.11)
RDW: 13 % (ref 11.5–15.5)
WBC: 9.1 10*3/uL (ref 4.0–10.5)
nRBC: 0 % (ref 0.0–0.2)

## 2019-01-10 LAB — COMPREHENSIVE METABOLIC PANEL
ALT: 50 U/L — ABNORMAL HIGH (ref 0–44)
AST: 86 U/L — ABNORMAL HIGH (ref 15–41)
Albumin: 2.8 g/dL — ABNORMAL LOW (ref 3.5–5.0)
Alkaline Phosphatase: 94 U/L (ref 38–126)
Anion gap: 6 (ref 5–15)
BUN: 7 mg/dL (ref 6–20)
CO2: 23 mmol/L (ref 22–32)
Calcium: 7.8 mg/dL — ABNORMAL LOW (ref 8.9–10.3)
Chloride: 104 mmol/L (ref 98–111)
Creatinine, Ser: <0.3 mg/dL — ABNORMAL LOW (ref 0.44–1.00)
Glucose, Bld: 90 mg/dL (ref 70–99)
Potassium: 3.3 mmol/L — ABNORMAL LOW (ref 3.5–5.1)
Sodium: 133 mmol/L — ABNORMAL LOW (ref 135–145)
Total Bilirubin: 3.7 mg/dL — ABNORMAL HIGH (ref 0.3–1.2)
Total Protein: 5.5 g/dL — ABNORMAL LOW (ref 6.5–8.1)

## 2019-01-10 LAB — MAGNESIUM: Magnesium: 1.7 mg/dL (ref 1.7–2.4)

## 2019-01-10 MED ORDER — POTASSIUM CHLORIDE CRYS ER 20 MEQ PO TBCR
40.0000 meq | EXTENDED_RELEASE_TABLET | Freq: Once | ORAL | Status: AC
  Administered 2019-01-10: 40 meq via ORAL
  Filled 2019-01-10: qty 2

## 2019-01-10 MED ORDER — ZOLPIDEM TARTRATE 5 MG PO TABS
5.0000 mg | ORAL_TABLET | Freq: Every evening | ORAL | Status: DC | PRN
  Administered 2019-01-10: 5 mg via ORAL
  Filled 2019-01-10: qty 1

## 2019-01-10 MED ORDER — LORAZEPAM 2 MG/ML IJ SOLN
1.0000 mg | Freq: Once | INTRAMUSCULAR | Status: AC
  Administered 2019-01-10: 1 mg via INTRAVENOUS

## 2019-01-10 NOTE — Progress Notes (Signed)
PROGRESS NOTE    Tammy Tran  QPY:[id number] DOB: September 25, 1970 DOA: 01/07/2019 PCP: Patient, No Pcp Per    Brief Narrative:  49 y.o. female with medical history significant of EtOH abuse. Reports with 3 days of nausea that has worsened to epigastric and LUQ abdominal pain. She reports that a few days ago, she would feel nauseous through out the day; especially with eating. However, she could tolerate it for the most part. She did not try any OTCs or other meds to help. She was able to continue drinking her alcohol. She reports that yesterday when she tried to eat and drink, she had pain in the epigastric area and felt severely nauseous. She states she "pulled an all-nighter" with nausea and vomiting. She became concerned and sought help in the ED.   ED Course: CT revealed acute pancreatitis. TRH was called for admission  Assessment & Plan:   Principal Problem:   Acute pancreatitis Active Problems:   ETOH abuse   Alcohol withdrawal (HCC)   HTN (hypertension)   Elevated LFTs   Macrocytosis   Hypokalemia   Tobacco abuse   Acute Alcoholic Pancreatitis     - no bilary duct dilation seen on CT     - TG of 92     - Continued on IV fluids, Banana bag ordered at time of presentation     - Presenting lipase of 569. -Lipase now normalized. Plan to advance diet as tolerated  EtOH abuse EtOH withdrawal     - drinks daily; "one large cup... I like vodka"     - reports that she will wake up fine, but by the end of the workday, she will have the shakes if she does not have any alcohol     - Continued on librium 25mg  TID; CIWA protocol, telemetry bed, banana bag, folate/thiamine daily     -  Continued on CIWA protocol. CIWA peaked to over 20's overnight, improved to 10 this AM with ativan -Continue to give additional doses of ativan as needed. Consider precedex only if liberal amounts of ativan are ineffective  HTN     - likely related to above     - Continued on PRN hydralazine as  needed  Hypokalemia, hypomagnesemia     - potassium low this AM. Have replaced -Mg corrected -Recheck lytes in AM  Elevated LFTs     - hepatic steatosis and hepatomegaly seen on CT     - hepatitis panel neg     - Follow LFT's in AM  Macrocytosis     - B12 190 -Folate pending -TSH 1.075, stable  Tobacco abuse     - counseled against further use at time of presentation     - continue nicotine patch  DVT prophylaxis: Heparin subq Code Status: Full Family Communication: Pt in room,family not at bedside Disposition Plan: Uncertain at this time  Consultants:     Procedures:     Antimicrobials: Anti-infectives (From admission, onward)   None      Subjective: Difficult to assess given current mentation  Objective: Vitals:   01/09/19 2028 01/10/19 0233 01/10/19 0633 01/10/19 1337  BP: (!) 143/103 (!) 141/97  (!) 135/96  Pulse: 98 96 97 84  Resp: 18 18 20 18   Temp: 98.7 F (37.1 C) 98.4 F (36.9 C)  99.8 F (37.7 C)  TempSrc: Axillary Oral  Oral  SpO2: 98% 97%  100%  Weight:      Height:       No  intake or output data in the 24 hours ending 01/10/19 1537 Filed Weights   01/07/19 0526  Weight: 56.7 kg    Examination: General exam: Conversant, in no acute distress Respiratory system: normal chest rise, clear, no audible wheezing Cardiovascular system: regular rhythm, s1-s2 Gastrointestinal system: Nondistended, nontender, pos BS Central nervous system: No seizures, no tremors Extremities: No cyanosis, no joint deformities Skin: No rashes, no pallor Psychiatry: Affect normal // no auditory hallucinations   Data Reviewed: I have personally reviewed following labs and imaging studies  CBC: Recent Labs  Lab 01/07/19 0619 01/07/19 1228 01/08/19 0622 01/09/19 0533 01/10/19 0811  WBC 9.3 11.9* 12.1* 13.8* 9.1  HGB 13.9 14.1 13.0 12.3 11.2*  HCT 40.2 41.3  39.5 38.1 36.6 32.9*  MCV 111.0* 112.8* 113.4* 113.3* 113.8*  PLT 223 215 160 154 144*    Basic Metabolic Panel: Recent Labs  Lab 01/07/19 0619 01/07/19 1228 01/08/19 0622 01/08/19 0712 01/09/19 0533 01/10/19 0811  NA 138  --  135  --  133* 133*  K 2.9*  --  3.1*  --  3.9 3.3*  CL 95*  --  96*  --  103 104  CO2 25  --  28  --  21* 23  GLUCOSE 124*  --  85  --  86 90  BUN 7  --  10  --  10 7  CREATININE 0.56 0.53 0.47  --  0.46 <0.30*  CALCIUM 8.5*  --  7.5*  --  7.8* 7.8*  MG  --  1.0*  --  1.8 1.9 1.7   GFR: CrCl cannot be calculated (This lab value cannot be used to calculate CrCl because it is not a number: <0.30). Liver Function Tests: Recent Labs  Lab 01/07/19 0619 01/08/19 0622 01/09/19 0533 01/10/19 0811  AST 155* 80* 73* 86*  ALT 82* 53* 41 50*  ALKPHOS 154* 112 101 94  BILITOT 2.4* 3.3* 3.7* 3.7*  PROT 7.5 6.4* 6.0* 5.5*  ALBUMIN 4.0 3.2* 3.0* 2.8*   Recent Labs  Lab 01/07/19 0619 01/09/19 0533  LIPASE 569* 66*   No results for input(s): AMMONIA in the last 168 hours. Coagulation Profile: Recent Labs  Lab 01/07/19 1227 01/08/19 0622  INR 1.1 1.2   Cardiac Enzymes: No results for input(s): CKTOTAL, CKMB, CKMBINDEX, TROPONINI in the last 168 hours. BNP (last 3 results) No results for input(s): PROBNP in the last 8760 hours. HbA1C: No results for input(s): HGBA1C in the last 72 hours. CBG: No results for input(s): GLUCAP in the last 168 hours. Lipid Profile: No results for input(s): CHOL, HDL, LDLCALC, TRIG, CHOLHDL, LDLDIRECT in the last 72 hours. Thyroid Function Tests: No results for input(s): TSH, T4TOTAL, FREET4, T3FREE, THYROIDAB in the last 72 hours. Anemia Panel: No results for input(s): VITAMINB12, FOLATE, FERRITIN, TIBC, IRON, RETICCTPCT in the last 72 hours. Sepsis Labs: No results for input(s): PROCALCITON, LATICACIDVEN in the last 168 hours.  Recent Results (from the past 240 hour(s))  SARS CORONAVIRUS 2 (TAT 6-24 HRS) Nasopharyngeal Nasopharyngeal Swab     Status: None   Collection Time: 01/07/19  8:34 AM    Specimen: Nasopharyngeal Swab  Result Value Ref Range Status   SARS Coronavirus 2 NEGATIVE NEGATIVE Final    Comment: (NOTE) SARS-CoV-2 target nucleic acids are NOT DETECTED. The SARS-CoV-2 RNA is generally detectable in upper and lower respiratory specimens during the acute phase of infection. Negative results do not preclude SARS-CoV-2 infection, do not rule out co-infections with other pathogens, and  should not be used as the sole basis for treatment or other patient management decisions. Negative results must be combined with clinical observations, patient history, and epidemiological information. The expected result is Negative. Fact Sheet for Patients: SugarRoll.be Fact Sheet for Healthcare Providers: https://www.woods-mathews.com/ This test is not yet approved or cleared by the [place] FDA and  has been authorized for detection and/or diagnosis of SARS-CoV-2 by FDA under an Emergency Use Authorization (EUA). This EUA will remain  in effect (meaning this test can be used) for the duration of the COVID-19 declaration under Section 56 4(b)(1) of the Act, 21 U.S.C. section 360bbb-3(b)(1), unless the authorization is terminated or revoked sooner. Performed at [facility], [place] [address] [place], [place] [address]      Radiology Studies: No results found.  Scheduled Meds: . chlordiazePOXIDE  25 mg Oral TID  . folic acid  1 mg Oral Daily  . heparin  5,000 Units Subcutaneous Q8H  . LORazepam  0-4 mg Intravenous Q12H  . metoprolol tartrate  25 mg Oral BID  . multivitamin with minerals  1 tablet Oral Daily  . nicotine  14 mg Transdermal Q24H  . potassium chloride  40 mEq Oral Once  . thiamine  100 mg Oral Daily   Continuous Infusions: . sodium chloride 1,000 mL (01/07/19 0820)  . sodium chloride 75 mL/hr at 01/10/19 1444     LOS: 3 days   Marylu Lund, MD [facility] Pager On Amion  If 7PM-7AM, please  contact night-coverage 01/10/2019, 3:37 PM

## 2019-01-10 NOTE — Progress Notes (Signed)
Patient restless and agitated. Pulling IV pole and pulling off  tele. All 4 RNs have tried to re-orient patient. Patient was able to speak with husband, patient still restless climbing over bed rail. Jerking back from RN when assisting patient to stand as requested and patient unsteady and stumbling. Music played, repositioned patient. Ativan 4mg  given every hour as verbal ordered. Nothing effective. On call stated patient has to play catch up since ativan wasn't given as frequently.  On call ordered for restraints and one extra dose ativan. RN have not applied restraints yet.Will see if Ativan kicks in and patient calms down. Will continue to monitor.

## 2019-01-11 LAB — VITAMIN B1: Vitamin B1 (Thiamine): 151.9 nmol/L (ref 66.5–200.0)

## 2019-01-11 LAB — COMPREHENSIVE METABOLIC PANEL
ALT: 59 U/L — ABNORMAL HIGH (ref 0–44)
AST: 92 U/L — ABNORMAL HIGH (ref 15–41)
Albumin: 2.8 g/dL — ABNORMAL LOW (ref 3.5–5.0)
Alkaline Phosphatase: 117 U/L (ref 38–126)
Anion gap: 7 (ref 5–15)
BUN: 6 mg/dL (ref 6–20)
CO2: 23 mmol/L (ref 22–32)
Calcium: 7.8 mg/dL — ABNORMAL LOW (ref 8.9–10.3)
Chloride: 104 mmol/L (ref 98–111)
Creatinine, Ser: 0.37 mg/dL — ABNORMAL LOW (ref 0.44–1.00)
GFR calc Af Amer: >60 mL/min (ref >60)
GFR calc non Af Amer: >60 mL/min (ref >60)
Glucose, Bld: 94 mg/dL (ref 70–99)
Potassium: 3.3 mmol/L — ABNORMAL LOW (ref 3.5–5.1)
Sodium: 134 mmol/L — ABNORMAL LOW (ref 135–145)
Total Bilirubin: 3 mg/dL — ABNORMAL HIGH (ref 0.3–1.2)
Total Protein: 5.9 g/dL — ABNORMAL LOW (ref 6.5–8.1)

## 2019-01-11 MED ORDER — POTASSIUM CHLORIDE CRYS ER 20 MEQ PO TBCR
40.0000 meq | EXTENDED_RELEASE_TABLET | Freq: Once | ORAL | Status: AC
  Administered 2019-01-11: 40 meq via ORAL
  Filled 2019-01-11: qty 2

## 2019-01-11 MED ORDER — POTASSIUM CHLORIDE CRYS ER 20 MEQ PO TBCR: 40.0000 meq | EXTENDED_RELEASE_TABLET | Freq: Once | ORAL | Status: DC

## 2019-01-11 MED ORDER — VITAMIN B-12 1000 MCG PO TABS
1000.0000 ug | ORAL_TABLET | Freq: Every day | ORAL | Status: DC
  Administered 2019-01-11 – 2019-01-12 (×2): 1000 ug via ORAL
  Filled 2019-01-11 (×2): qty 1

## 2019-01-11 MED ORDER — CHLORDIAZEPOXIDE HCL 25 MG PO CAPS
25.0000 mg | ORAL_CAPSULE | Freq: Two times a day (BID) | ORAL | Status: DC
  Administered 2019-01-11 – 2019-01-12 (×2): 25 mg via ORAL
  Filled 2019-01-11 (×2): qty 1

## 2019-01-11 NOTE — Progress Notes (Signed)
PROGRESS NOTE    Tammy Tran  GHW:[id number] DOB: [birth year] DOA: 01/07/2019 PCP: Patient, No Pcp Per    Brief Narrative:  49 y.o. female with medical history significant of EtOH abuse. Reports with 3 days of nausea that has worsened to epigastric and LUQ abdominal pain. She reports that a few days ago, she would feel nauseous through out the day; especially with eating. However, she could tolerate it for the most part. She did not try any OTCs or other meds to help. She was able to continue drinking her alcohol. She reports that yesterday when she tried to eat and drink, she had pain in the epigastric area and felt severely nauseous. She states she "pulled an all-nighter" with nausea and vomiting. She became concerned and sought help. In the ED, CT revealed acute pancreatitis. TRH was called for admission  Assessment & Plan:   Principal Problem:   Acute pancreatitis Active Problems:   ETOH abuse   Alcohol withdrawal (HCC)   HTN (hypertension)   Elevated LFTs   Macrocytosis   Hypokalemia   Tobacco abuse   Acute Alcoholic Pancreatitis Lipase on admission 569, trended down TG of 92 No bilary duct dilation seen on CT Continued on IV fluids, advance to full liquid diet Monitor closely  EtOH abuse/withdrawal Drinks daily; "one large cup... I like vodka" Advised to quit CIWA protocol Continued on librium, plan to taper off   Folate/thiamine daily  HTN Likely related to above Continued on PRN hydralazine as needed  Hypokalemia Replace as needed  Elevated LFTs Hepatic steatosis and hepatomegaly seen on CT Hepatitis panel neg Daily CMP  Macrocytosis Vitamin B12 190, deficient, will replace Folate pending TSH 1.075, stable  Tobacco abuse Counseled against further use at time of presentation  Continue nicotine patch     DVT prophylaxis: Heparin subq Code Status: Full Family Communication: None at bedside Disposition Plan: Pending PT/OT  evaluation    Consultants:   None  Procedures:   None  Antimicrobials: Anti-infectives (From admission, onward)   None      Subjective: Patient examined at bedside, very drowsy/sleepy, unable to engage in a conversation.  Looked comfortable, not in any distress  Objective: Vitals:   01/10/19 1809 01/11/19 0019 01/11/19 0550 01/11/19 1151  BP: (!) 137/103 (!) 144/99 131/89 (!) 148/99  Pulse: 93 90 89 89  Resp: 18 20 20 18   Temp: 98.5 F (36.9 C) 98.6 F (37 C) 98.9 F (37.2 C) 98.8 F (37.1 C)  TempSrc: Oral Oral Oral Oral  SpO2: 100% 98% 100% 100%  Weight:      Height:       No intake or output data in the 24 hours ending 01/11/19 1402 Filed Weights   01/07/19 0526  Weight: 56.7 kg    Examination:  General: NAD, drowsy/sleepy  Cardiovascular: S1, S2 present  Respiratory: CTAB  Abdomen: Soft, nontender, nondistended, bowel sounds present  Musculoskeletal: No bilateral pedal edema noted  Skin: Normal  Psychiatry: Normal mood   Data Reviewed: I have personally reviewed following labs and imaging studies  CBC: Recent Labs  Lab 01/07/19 0619 01/07/19 1228 01/08/19 0622 01/09/19 0533 01/10/19 0811  WBC 9.3 11.9* 12.1* 13.8* 9.1  HGB 13.9 14.1 13.0 12.3 11.2*  HCT 40.2 41.3  39.5 38.1 36.6 32.9*  MCV 111.0* 112.8* 113.4* 113.3* 113.8*  PLT 223 215 160 154 144*   Basic Metabolic Panel: Recent Labs  Lab 01/07/19 03/07/19 01/07/19 1228 01/08/19 0622 01/08/19 03/08/19 01/09/19 0533 01/10/19 03/10/19 01/11/19 03/11/19  NA 138  --  135  --  133* 133* 134*  K 2.9*  --  3.1*  --  3.9 3.3* 3.3*  CL 95*  --  96*  --  103 104 104  CO2 25  --  28  --  21* 23 23  GLUCOSE 124*  --  85  --  86 90 94  BUN 7  --  10  --  10 7 6   CREATININE 0.56 0.53 0.47  --  0.46 <0.30* 0.37*  CALCIUM 8.5*  --  7.5*  --  7.8* 7.8* 7.8*  MG  --  1.0*  --  1.8 1.9 1.7  --    GFR: Estimated Creatinine Clearance: 78.7 mL/min (A) (by C-G formula based on SCr of 0.37 mg/dL  (L)). Liver Function Tests: Recent Labs  Lab 01/07/19 0619 01/08/19 0622 01/09/19 0533 01/10/19 0811 01/11/19 0523  AST 155* 80* 73* 86* 92*  ALT 82* 53* 41 50* 59*  ALKPHOS 154* 112 101 94 117  BILITOT 2.4* 3.3* 3.7* 3.7* 3.0*  PROT 7.5 6.4* 6.0* 5.5* 5.9*  ALBUMIN 4.0 3.2* 3.0* 2.8* 2.8*   Recent Labs  Lab 01/07/19 0619 01/09/19 0533  LIPASE 569* 66*   No results for input(s): AMMONIA in the last 168 hours. Coagulation Profile: Recent Labs  Lab 01/07/19 1227 01/08/19 0622  INR 1.1 1.2   Cardiac Enzymes: No results for input(s): CKTOTAL, CKMB, CKMBINDEX, TROPONINI in the last 168 hours. BNP (last 3 results) No results for input(s): PROBNP in the last 8760 hours. HbA1C: No results for input(s): HGBA1C in the last 72 hours. CBG: No results for input(s): GLUCAP in the last 168 hours. Lipid Profile: No results for input(s): CHOL, HDL, LDLCALC, TRIG, CHOLHDL, LDLDIRECT in the last 72 hours. Thyroid Function Tests: No results for input(s): TSH, T4TOTAL, FREET4, T3FREE, THYROIDAB in the last 72 hours. Anemia Panel: No results for input(s): VITAMINB12, FOLATE, FERRITIN, TIBC, IRON, RETICCTPCT in the last 72 hours. Sepsis Labs: No results for input(s): PROCALCITON, LATICACIDVEN in the last 168 hours.  Recent Results (from the past 240 hour(s))  SARS CORONAVIRUS 2 (TAT 6-24 HRS) Nasopharyngeal Nasopharyngeal Swab     Status: None   Collection Time: 01/07/19  8:34 AM   Specimen: Nasopharyngeal Swab  Result Value Ref Range Status   SARS Coronavirus 2 NEGATIVE NEGATIVE Final    Comment: (NOTE) SARS-CoV-2 target nucleic acids are NOT DETECTED. The SARS-CoV-2 RNA is generally detectable in upper and lower respiratory specimens during the acute phase of infection. Negative results do not preclude SARS-CoV-2 infection, do not rule out co-infections with other pathogens, and should not be used as the sole basis for treatment or other patient management decisions. Negative  results must be combined with clinical observations, patient history, and epidemiological information. The expected result is Negative. Fact Sheet for Patients: 03/07/19 Fact Sheet for Healthcare Providers: HairSlick.no This test is not yet approved or cleared by the quierodirigir.com FDA and  has been authorized for detection and/or diagnosis of SARS-CoV-2 by FDA under an Emergency Use Authorization (EUA). This EUA will remain  in effect (meaning this test can be used) for the duration of the COVID-19 declaration under Section 56 4(b)(1) of the Act, 21 U.S.C. section 360bbb-3(b)(1), unless the authorization is terminated or revoked sooner. Performed at [facility], [address], [place], [place]      Radiology Studies: No results found.  Scheduled Meds: . chlordiazePOXIDE  25 mg Oral TID  .  folic acid  1 mg Oral Daily  . heparin  5,000 Units Subcutaneous Q8H  . metoprolol tartrate  25 mg Oral BID  . multivitamin with minerals  1 tablet Oral Daily  . nicotine  14 mg Transdermal Q24H  . thiamine  100 mg Oral Daily   Continuous Infusions: . sodium chloride 1,000 mL (01/07/19 0820)  . sodium chloride 75 mL/hr at 01/11/19 5284     LOS: 4 days   Alma Friendly, MD [facility] Pager On Amion  If 7PM-7AM, please contact night-coverage 01/11/2019, 2:02 PM

## 2019-01-12 LAB — CBC WITH DIFFERENTIAL/PLATELET
Abs Immature Granulocytes: 0.03 10*3/uL (ref 0.00–0.07)
Basophils Absolute: 0.1 10*3/uL (ref 0.0–0.1)
Basophils Relative: 1 %
Eosinophils Absolute: 0.2 10*3/uL (ref 0.0–0.5)
Eosinophils Relative: 2 %
HCT: 34.3 % — ABNORMAL LOW (ref 36.0–46.0)
Hemoglobin: 11.6 g/dL — ABNORMAL LOW (ref 12.0–15.0)
Immature Granulocytes: 0 %
Lymphocytes Relative: 13 %
Lymphs Abs: 1 10*3/uL (ref 0.7–4.0)
MCH: 38.7 pg — ABNORMAL HIGH (ref 26.0–34.0)
MCHC: 33.8 g/dL (ref 30.0–36.0)
MCV: 114.3 fL — ABNORMAL HIGH (ref 80.0–100.0)
Monocytes Absolute: 1.3 10*3/uL — ABNORMAL HIGH (ref 0.1–1.0)
Monocytes Relative: 18 %
Neutro Abs: 4.8 10*3/uL (ref 1.7–7.7)
Neutrophils Relative %: 66 %
Platelets: 227 10*3/uL (ref 150–400)
RBC: 3 MIL/uL — ABNORMAL LOW (ref 3.87–5.11)
RDW: 13 % (ref 11.5–15.5)
WBC: 7.3 10*3/uL (ref 4.0–10.5)
nRBC: 0 % (ref 0.0–0.2)

## 2019-01-12 LAB — COMPREHENSIVE METABOLIC PANEL
ALT: 76 U/L — ABNORMAL HIGH (ref 0–44)
AST: 103 U/L — ABNORMAL HIGH (ref 15–41)
Albumin: 2.8 g/dL — ABNORMAL LOW (ref 3.5–5.0)
Alkaline Phosphatase: 121 U/L (ref 38–126)
Anion gap: 8 (ref 5–15)
BUN: <5 mg/dL — ABNORMAL LOW (ref 6–20)
CO2: 23 mmol/L (ref 22–32)
Calcium: 8 mg/dL — ABNORMAL LOW (ref 8.9–10.3)
Chloride: 102 mmol/L (ref 98–111)
Creatinine, Ser: 0.42 mg/dL — ABNORMAL LOW (ref 0.44–1.00)
GFR calc Af Amer: >60 mL/min (ref >60)
GFR calc non Af Amer: >60 mL/min (ref >60)
Glucose, Bld: 96 mg/dL (ref 70–99)
Potassium: 3.4 mmol/L — ABNORMAL LOW (ref 3.5–5.1)
Sodium: 133 mmol/L — ABNORMAL LOW (ref 135–145)
Total Bilirubin: 2.7 mg/dL — ABNORMAL HIGH (ref 0.3–1.2)
Total Protein: 6.2 g/dL — ABNORMAL LOW (ref 6.5–8.1)

## 2019-01-12 MED ORDER — FOLIC ACID 1 MG PO TABS: 1.0000 mg | ORAL_TABLET | Freq: Every day | ORAL | 0 refills | Status: AC

## 2019-01-12 MED ORDER — AMLODIPINE BESYLATE 5 MG PO TABS: 5.0000 mg | ORAL_TABLET | Freq: Every day | ORAL | 0 refills | Status: DC

## 2019-01-12 MED ORDER — CYANOCOBALAMIN 1000 MCG PO TABS: 1000.0000 ug | ORAL_TABLET | Freq: Every day | ORAL | 0 refills | Status: AC

## 2019-01-12 MED ORDER — CHLORDIAZEPOXIDE HCL 25 MG PO CAPS: 25.0000 mg | ORAL_CAPSULE | Freq: Every day | ORAL | 0 refills | Status: AC

## 2019-01-12 MED ORDER — NICOTINE 14 MG/24HR TD PT24: 14.0000 mg | MEDICATED_PATCH | TRANSDERMAL | 0 refills | Status: DC

## 2019-01-12 MED ORDER — AMLODIPINE BESYLATE 5 MG PO TABS: 5.0000 mg | ORAL_TABLET | Freq: Every day | ORAL | Status: DC

## 2019-01-12 MED ORDER — METOPROLOL TARTRATE 25 MG PO TABS: 25.0000 mg | ORAL_TABLET | Freq: Two times a day (BID) | ORAL | 0 refills | Status: DC

## 2019-01-12 NOTE — Evaluation (Signed)
Occupational Therapy Evaluation Patient Details Name: Tammy Tran MRN: [id number] DOB: 1970/07/16 Today's Date: 01/12/2019    History of Present Illness 49 y.o. female with medical history significant of EtOH abuse. Reports with 3 days of nausea that has worsened to epigastric and LUQ abdominal pain. Dx of acute pancreatits.   Clinical Impression   Pt was admitted for the above. At baseline, she is independent and works 2 jobs.  She lives with her husband, who is currently working from home. Pt needs min guard to min A for adls. Will follow in acute setting with mod I level goals    Follow Up Recommendations  (supervision for mobility)    Equipment Recommendations  (? chair for shower, depending upon progress)    Recommendations for Other Services       Precautions / Restrictions Precautions Precautions: Fall Precaution Comments: denies h/o falls; unsteady on eval Restrictions Weight Bearing Restrictions: No      Mobility Bed Mobility Overal bed mobility: Modified Independent             General bed mobility comments: HOB up, used rail  Transfers Overall transfer level: Needs assistance Equipment used: Rolling walker (2 wheeled) Transfers: Sit to/from Stand Sit to Stand: Min guard         General transfer comment: also stood from commode with grab bar at this level of assistance    Balance Overall balance assessment: Needs assistance   Sitting balance-Leahy Scale: Good       Standing balance-Leahy Scale: Fair Standing balance comment: relies on single UE support for dynamic standing                           ADL either performed or assessed with clinical judgement   ADL Overall ADL's : Needs assistance/impaired                                       General ADL Comments: Pt needs min guard to min A for ADLs for balance and for toilet transfers.  She was steadier with RW but chose not to use it to walk to bathroom. She  tended to hold to walls and IV pole but was still unsteady     Vision         Perception     Praxis      Pertinent Vitals/Pain Pain Assessment: No/denies pain     Hand Dominance     Extremity/Trunk Assessment Upper Extremity Assessment Upper Extremity Assessment: (grossly 4/5) WFLs      Cervical / Trunk Assessment Cervical / Trunk Assessment: Normal   Communication Communication Communication: No difficulties   Cognition Arousal/Alertness: Awake/alert Behavior During Therapy: WFL for tasks assessed/performed Overall Cognitive Status: No family/caregiver present to determine baseline cognitive functioning                                 General Comments: slow processing time.  Pt tearful at end of session   General Comments       Exercises Exercises: (gave pt level 2 theraband to work with on her own)   Shoulder Instructions      Home Living Family/patient expects to be discharged to:: Private residence Living Arrangements: Spouse/significant other;Children Available Help at Discharge: Family;Available 24 hours/day   Home Access: Stairs to enter Entrance  Stairs-Number of Steps: 5 Entrance Stairs-Rails: Right Home Layout: One level     Bathroom Shower/Tub: Walk-in shower         Home Equipment: None          Prior Functioning/Environment Level of Independence: Independent        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 Also has a new granddaughter she is excited  about OT Goal Formulation: With patient Time For Goal Achievement: 01/26/19 Potential to Achieve Goals: Good ADL Goals Pt Will Transfer to Toilet: with modified independence;regular height toilet;ambulating Pt Will Perform Tub/Shower Transfer: Shower transfer;ambulating;with modified independence(? seat) Additional ADL Goal #1: pt will gather adl supplies at mod I level  OT Frequency: Min 2X/week   Barriers to D/C:            Co-evaluation PT/OT/SLP Co-Evaluation/Treatment: Yes Reason for Co-Treatment: For patient/therapist safety PT goals addressed during session: Mobility/safety with mobility OT goals addressed during session: ADL's and self-care      AM-PAC OT "6 Clicks" Daily Activity     Outcome Measure Help from another person eating meals?: None Help from another person taking care of personal grooming?: A Little Help from another person toileting, which includes using toliet, bedpan, or urinal?: A Little Help from another person bathing (including washing, rinsing, drying)?: A Little Help from another person to put on and taking off regular upper body clothing?: A Little Help from another person to put on and taking off regular lower body clothing?: A Little 6 Click Score: 19   End of Session    Activity Tolerance: Patient tolerated treatment well Patient left: in chair;with call bell/phone within reach;with chair alarm set  OT Visit Diagnosis: Unsteadiness on feet (R26.81)                Time: 9798-9211 OT Time Calculation (min): 25 min Charges:  OT General Charges $OT Visit: 1 Visit OT Evaluation $OT Eval Low Complexity: 1 Low  Uziel Covault S, OTR/L Acute Rehabilitation Services 01/12/2019  [place] 01/12/2019, 11:14 AM

## 2019-01-12 NOTE — Discharge Summary (Addendum)
Discharge Summary  Tammy Tran EYC:[id number] DOB: Jul 14, 1970  PCP: Patient, No Pcp Per  Admit date: 01/07/2019 Discharge date: 01/12/2019  Time spent: 40 mins  Recommendations for Outpatient Follow-up:  1. Patient advised to establish care with a PCP 2. Patient advised to establish care with a mental health physician  Discharge Diagnoses:  Active Hospital Problems   Diagnosis Date Noted  . Acute pancreatitis 01/07/2019  . ETOH abuse 01/07/2019  . Alcohol withdrawal (HCC) 01/07/2019  . HTN (hypertension) 01/07/2019  . Elevated LFTs 01/07/2019  . Macrocytosis 01/07/2019  . Hypokalemia 01/07/2019  . Tobacco abuse 01/07/2019    Resolved Hospital Problems  No resolved problems to display.    Discharge Condition: Stable  Diet recommendation: Regular  Vitals:   01/12/19 0606 01/12/19 1512  BP: (!) 148/94 (!) 119/95  Pulse: 84 94  Resp: 15   Temp: 98.1 F (36.7 C) 98.5 F (36.9 C)  SpO2: 98% 99%    History of present illness:  49 y.o.femalewith medical history significant ofEtOH abuse. Reports with 3 days of nausea that has worsened to epigastric and LUQ abdominal pain. She reports that a few days ago, she would feel nauseous through out the day; especially with eating. However, she could tolerate it for the most part. She did not try any OTCs or other meds to help. She was able to continue drinking her alcohol. She reports that yesterday when she tried to eat and drink, she had pain in the epigastric area and felt severely nauseous. She states she "pulled an all-nighter" with nausea and vomiting. She became concerned and sought help. In the ED, CT revealed acute pancreatitis. [facility] was called for admission.    Today, patient reported feeling better although, still with generalized weakness.  Patient stated her mom passed away in 05/03/2017 due to multiple medical issues including alcoholic liver disease, and since then patient has been abusing alcohol.  Patient reported trying to  quit cold [place] as well as AA meetings of which both have failed.  Patient encouraged to keep trying, social worker gave patient resources/information, patient looking into doing 3-week in house rehab program.  Patient was able to tolerate her food, denies any mental pain, nausea/vomiting, fever/chills, chest pain, shortness of breath, cough, dizziness.  Patient will be discharged home with a roller walker to enable ambulate safely for the meantime until she fully recovers.  Eager to be discharged, misses her 3 kids, husband and her granddaughter  Hospital Course:  Principal Problem:   Acute pancreatitis Active Problems:   ETOH abuse   Alcohol withdrawal (HCC)   HTN (hypertension)   Elevated LFTs   Macrocytosis   Hypokalemia   Tobacco abuse   Acute Alcoholic Pancreatitis Lipase on admission 569, trended down TG of 92 No bilary duct dilation seen on CT Able to tolerate regular diet Advised to establish care with a PCP  EtOH abuse/withdrawal Drinks daily; "one large cup... I like vodka" Advised to quit Discharged on tapered dose of librium Folate/MVT daily  HTN Likely related to above Discharged on amlodipine 5 mg daily Advised to establish care with a PCP to follow-up  Hypokalemia Replace as needed  Elevated LFTs Hepatic steatosis and hepatomegaly seen on CT Hepatitis panel neg Advised to establish care with a PCP for follow-up  Macrocytosis Vitamin B12 190 Discharged on vitamin B12 supplement  Tobacco abuse Counseled against further use Nicotine patch on discharge        Malnutrition Type:      Malnutrition Characteristics:  Nutrition Interventions:      Estimated body mass index is 19.58 kg/m as calculated from the following:   Height as of this encounter: 5\' 7"  (1.702 m).   Weight as of this encounter: 56.7 kg.    Procedures:  None  Consultations:  None  Discharge Exam: BP (!) 119/95 (BP Location: Left Arm)   Pulse 94    Temp 98.5 F (36.9 C) (Oral)   Resp 15   Ht 5\' 7"  (1.702 m)   Wt 56.7 kg   SpO2 99%   BMI 19.58 kg/m   General: NAD, alert, awake, oriented x3 Cardiovascular: S1, S2 present Respiratory: CTA B  Discharge Instructions You were cared for by a hospitalist during your hospital stay. If you have any questions about your discharge medications or the care you received while you were in the hospital after you are discharged, you can call the unit and asked to speak with the hospitalist on call if the hospitalist that took care of you is not available. Once you are discharged, your primary care physician will handle any further medical issues. Please note that NO REFILLS for any discharge medications will be authorized once you are discharged, as it is imperative that you return to your primary care physician (or establish a relationship with a primary care physician if you do not have one) for your aftercare needs so that they can reassess your need for medications and monitor your lab values.  Discharge Instructions    Diet - low sodium heart healthy   Complete by: As directed    Increase activity slowly   Complete by: As directed      Allergies as of 01/12/2019      Reactions   Erythromycin Diarrhea, Nausea And Vomiting      Medication List    TAKE these medications   amLODipine 5 MG tablet Commonly known as: NORVASC Take 1 tablet (5 mg total) by mouth daily.   calcium carbonate 500 MG chewable tablet Commonly known as: TUMS - dosed in mg elemental calcium Chew 1 tablet by mouth daily as needed for indigestion or heartburn.   cetirizine 10 MG tablet Commonly known as: ZYRTEC Take 10 mg by mouth daily.   chlordiazePOXIDE 25 MG capsule Commonly known as: LIBRIUM Take 1 capsule (25 mg total) by mouth daily for 2 days.   cyanocobalamin 1000 MCG tablet Take 1 tablet (1,000 mcg total) by mouth daily. Start taking on: January 13, 2456   folic acid 1 MG tablet Commonly known as:  FOLVITE Take 1 tablet (1 mg total) by mouth daily. Start taking on: January 13, 2019   nicotine 14 mg/24hr patch Commonly known as: NICODERM CQ - dosed in mg/24 hours Place 1 patch (14 mg total) onto the skin daily.            Durable Medical Equipment  (From admission, onward)         Start     Ordered   01/12/19 1457  For home use only DME Walker rolling  Once    Question Answer Comment  Walker: With 5 Inch Wheels   Patient needs a walker to treat with the following condition Weakness      01/12/19 1459         Allergies  Allergen Reactions  . Erythromycin Diarrhea and Nausea And Vomiting      The results of significant diagnostics from this hospitalization (including imaging, microbiology, ancillary and laboratory) are listed below for reference.  Significant Diagnostic Studies: CT Abdomen Pelvis W Contrast  Result Date: 01/07/2019 CLINICAL DATA:  Abdominal pain, unspecified diffuse abdominal pain worse in left upper quadrant. Severe nausea and vomiting. EXAM: CT ABDOMEN AND PELVIS WITH CONTRAST TECHNIQUE: Multidetector CT imaging of the abdomen and pelvis was performed using the standard protocol following bolus administration of intravenous contrast. CONTRAST:  OMNIPAQUE IOHEXOL 300 MG/ML  SOLN COMPARISON:  No pertinent prior studies available for comparison. FINDINGS: LOWER CHEST: The imaged lungs are clear.The visualized heart is normal in size. HEPATOBILIARY: Severe hepatic steatosis with hepatomegaly and regions of focal fatty sparing. No appreciable biliary ductal dilatation. PANCREAS: The pancreas is edematous with surrounding peripancreatic inflammatory stranding and edema. Edema and fluid extends into the along the transverse and ascending colon, into the mesentery and anterior pararenal space. No organized peripancreatic collection is identified. No pancreatic necrosis is identified. No pancreatic ductal dilatation. SPLEEN: No focal lesion.No splenomegaly.  ADRENALS/URINARY TRACT: No adrenal gland mass. Symmetric renal enhancement. 4 mm hypodense lesion within the lower pole of the right kidney too small to characterize. No hydronephrosis. The bladder is unremarkable for the degree of distension STOMACH/BOWEL: Edema surrounding the stomach. The stomach otherwise unremarkable. No dilated loops of bowel or bowel wall thickening. Normal appendix. VASCULAR/LYMPHATIC: No abdominal aortic aneurysm.  Minimal aortic atherosclerosis. The IVC, portal vein, SMV and splenic vein are patent. No lymphadenopathy. REPRODUCTIVE: Incompletely assessed by CT modality.No pathologic findings. Intrauterine device. OTHER: Small volume pelvic free fluid.The body wall is normal. MUSCULOSKELETAL: No acute bony abnormality. IMPRESSION: Findings consistent with acute pancreatitis. Prominent peripancreatic stranding and fluid, with fluid extending along the stomach, transverse and ascending colon and into the anterior pararenal space. No organized peripancreatic collection is demonstrated on the current examination. No pancreatic necrosis. Severe hepatic steatosis and hepatomegaly with regions of focal fatty sparing. Electronically Signed   By: Jackey Loge DO   On: 01/07/2019 08:24    Microbiology: Recent Results (from the past 240 hour(s))  SARS CORONAVIRUS 2 (TAT 6-24 HRS) Nasopharyngeal Nasopharyngeal Swab     Status: None   Collection Time: 01/07/19  8:34 AM   Specimen: Nasopharyngeal Swab  Result Value Ref Range Status   SARS Coronavirus 2 NEGATIVE NEGATIVE Final    Comment: (NOTE) SARS-CoV-2 target nucleic acids are NOT DETECTED. The SARS-CoV-2 RNA is generally detectable in upper and lower respiratory specimens during the acute phase of infection. Negative results do not preclude SARS-CoV-2 infection, do not rule out co-infections with other pathogens, and should not be used as the sole basis for treatment or other patient management decisions. Negative results must be  combined with clinical observations, patient history, and epidemiological information. The expected result is Negative. Fact Sheet for Patients: HairSlick.no Fact Sheet for Healthcare Providers: quierodirigir.com This test is not yet approved or cleared by the [place] FDA and  has been authorized for detection and/or diagnosis of SARS-CoV-2 by FDA under an Emergency Use Authorization (EUA). This EUA will remain  in effect (meaning this test can be used) for the duration of the COVID-19 declaration under Section 56 4(b)(1) of the Act, 21 U.S.C. section 360bbb-3(b)(1), unless the authorization is terminated or revoked sooner. Performed at [facility], [address] [place], [place] [address]      Labs: Basic Metabolic Panel: Recent Labs  Lab 01/07/19 [phone] 01/07/19 1228 01/08/19 0622 01/08/19 6767 01/09/19 0533 01/10/19 0811 01/11/19 0523 01/12/19 0536  NA  --   --  135  --  133* 133* 134* 133*  K  --   --  3.1*  --  3.9 3.3* 3.3* 3.4*  CL  --   --  96*  --  103 104 104 102  CO2  --   --  28  --  21* 23 23 23   GLUCOSE  --   --  85  --  86 90 94 96  BUN  --   --  10  --  10 7 6  <5*  CREATININE   < > 0.53 0.47  --  0.46 <0.30* 0.37* 0.42*  CALCIUM  --   --  7.5*  --  7.8* 7.8* 7.8* 8.0*  MG  --  1.0*  --  1.8 1.9 1.7  --   --    < > = values in this interval not displayed.   Liver Function Tests: Recent Labs  Lab 01/08/19 0622 01/09/19 0533 01/10/19 0811 01/11/19 0523 01/12/19 0536  AST 80* 73* 86* 92* 103*  ALT 53* 41 50* 59* 76*  ALKPHOS 112 101 94 117 121  BILITOT 3.3* 3.7* 3.7* 3.0* 2.7*  PROT 6.4* 6.0* 5.5* 5.9* 6.2*  ALBUMIN 3.2* 3.0* 2.8* 2.8* 2.8*   Recent Labs  Lab 01/07/19 0619 01/09/19 0533  LIPASE 569* 66*   No results for input(s): AMMONIA in the last 168 hours. CBC: Recent Labs  Lab 01/07/19 1228 01/08/19 0622 01/09/19 0533 01/10/19 0811 01/12/19 0536  WBC 11.9* 12.1*  13.8* 9.1 7.3  NEUTROABS  --   --   --   --  4.8  HGB 14.1 13.0 12.3 11.2* 11.6*  HCT 41.3  39.5 38.1 36.6 32.9* 34.3*  MCV 112.8* 113.4* 113.3* 113.8* 114.3*  PLT 215 160 154 144* 227   Cardiac Enzymes: No results for input(s): CKTOTAL, CKMB, CKMBINDEX, TROPONINI in the last 168 hours. BNP: BNP (last 3 results) No results for input(s): BNP in the last 8760 hours.  ProBNP (last 3 results) No results for input(s): PROBNP in the last 8760 hours.  CBG: No results for input(s): GLUCAP in the last 168 hours.     Signed:  03/10/19, MD [facility] 01/12/2019, 3:20 PM

## 2019-01-12 NOTE — TOC Initial Note (Signed)
Transition of Care [facility]) - Initial/Assessment Note    Patient Details  Name: Tammy Tran MRN: [id number] Date of Birth: 01/10/1970  Transition of [facility]) CM/SW Contact:    Clearance Coots, LCSW Phone Number: 01/12/2019, 2:32 PM  Clinical Narrative:                 Patient tear upon arrival. Patient express, "I miss my husband and my kids, I cannot go home today because I have not walked enough." Patient reports she walked the hall with physical therapy but felt she could have walker further. CSW explain role and reason for visit. Patient agreeable to discuss SA resources with CSW. Patient reports she tried AA group but this was not helpful to her. Patient reports she prefers to discuss outpatient/residential options with her spouse. Patient reports her died in 04/20/17 and she started to drink more. She saw a therapist and felt it was very helpful. CSW provided patient with a list of psychiatrist and therapist in the area. Patient appreciative of the resources.   Patient has unsteady gait and requires RW.  RW ordered through [facility].   No other needs identified. TOC will sign off.   Expected Discharge Plan: Home/Self Care Barriers to Discharge: Continued Medical Work up   Patient Goals and CMS Choice Patient states their goals for this hospitalization and ongoing recovery are:: "get home to my husband and kids."   Choice offered to / list presented to : NA  Expected Discharge Plan and Services Expected Discharge Plan: Home/Self Care In-house Referral: Clinical Social Work Discharge Planning Services: CM Consult Post Acute Care Choice: Durable Medical Equipment Living arrangements for the past 2 months: Single Family Home                 DME Arranged: Walker rolling DME Agency: [employer] Date DME Agency Contacted: 01/12/19 Time DME Agency Contacted: 1430 Representative spoke with at DME Agency: [place]            Prior Living Arrangements/Services Living  arrangements for the past 2 months: Single Family Home Lives with:: Adult Children, Spouse Patient language and need for interpreter reviewed:: Yes        Need for Family Participation in Patient Care: Yes (Comment) Care giver support system in place?: Yes (comment)   Criminal Activity/Legal Involvement Pertinent to Current Situation/Hospitalization: No - Comment as needed  Activities of Daily Living Home Assistive Devices/Equipment: None ADL Screening (condition at time of admission) Patient's cognitive ability adequate to safely complete daily activities?: Yes Is the patient deaf or have difficulty hearing?: No Does the patient have difficulty seeing, even when wearing glasses/contacts?: No Does the patient have difficulty concentrating, remembering, or making decisions?: No Patient able to express need for assistance with ADLs?: Yes Does the patient have difficulty dressing or bathing?: No Independently performs ADLs?: Yes (appropriate for developmental age) Does the patient have difficulty walking or climbing stairs?: No Weakness of Legs: None Weakness of Arms/Hands: None  Permission Sought/Granted Permission sought to share information with : Family Supports, Case Manager Permission granted to share information with : Yes, Verbal Permission Granted              Emotional Assessment Appearance:: Appears stated age Attitude/Demeanor/Rapport: Engaged Affect (typically observed): Accepting, Tearful/Crying Orientation: : Oriented to Self, Oriented to Place, Oriented to  Time, Oriented to Situation Alcohol / Substance Use: Alcohol Use, Tobacco Use Psych Involvement: No (comment)  Admission diagnosis:  Acute pancreatitis [K85.90] Hypokalemia [E87.6] Prolonged Q-T  interval on ECG [R94.31] Non-intractable vomiting with nausea, unspecified vomiting type [R11.2] Patient Active Problem List   Diagnosis Date Noted  . Acute pancreatitis 01/07/2019  . ETOH abuse 01/07/2019  .  Alcohol withdrawal ([place]) 01/07/2019  . HTN (hypertension) 01/07/2019  . Elevated LFTs 01/07/2019  . Macrocytosis 01/07/2019  . Hypokalemia 01/07/2019  . Tobacco abuse 01/07/2019   PCP:  Patient, No Pcp Per Pharmacy:   [facility], [place] [address] [place] [address] Phone: [phone] Fax: [phone]     Social Determinants of Health (SDOH) Interventions    Readmission Risk Interventions No flowsheet data found.

## 2019-01-12 NOTE — Evaluation (Signed)
Physical Therapy Evaluation Patient Details Name: Tammy Tran MRN: [id number] DOB: 1970/09/19 Today's Date: 01/12/2019   History of Present Illness  49 y.o. female with medical history significant of EtOH abuse. Reports with 3 days of nausea that has worsened to epigastric and LUQ abdominal pain. Dx of acute pancreatits.  Clinical Impression  Pt admitted with above diagnosis. Pt ambulated 150' with RW with min/guard to min A for safety 2* unsteady gait. Pt has wide base of support and is mildly ataxic. At present she requires a RW and close supervision for safety.  Pt currently with functional limitations due to the deficits listed below (see PT Problem List). Pt will benefit from skilled PT to increase their independence and safety with mobility to allow discharge to the venue listed below.       Follow Up Recommendations Outpatient PT (if she is unsteady at time of DC);Supervision for mobility/OOB    Equipment Recommendations  Rolling walker with 5" wheels    Recommendations for Other Services       Precautions / Restrictions Precautions Precautions: Fall Precaution Comments: denies h/o falls; unsteady on eval Restrictions Weight Bearing Restrictions: No      Mobility  Bed Mobility Overal bed mobility: Modified Independent             General bed mobility comments: HOB up, used rail  Transfers Overall transfer level: Needs assistance Equipment used: Rolling walker (2 wheeled) Transfers: Sit to/from Stand Sit to Stand: Min guard         General transfer comment: VCs hand placement, min/guard for safety 2* unsteadiness  Ambulation/Gait Ambulation/Gait assistance: Min guard;Min assist Gait Distance (Feet): 150 Feet Assistive device: Rolling walker (2 wheeled) Gait Pattern/deviations: Step-through pattern;Decreased stride length;Wide base of support;Decreased dorsiflexion - right;Decreased dorsiflexion - left;Ataxic Gait velocity: slow   General Gait Details:  VCs for heel strike and for positioning in RW, min A to manage RW with turns, mildly ataxic  Stairs            Wheelchair Mobility    Modified Rankin (Stroke Patients Only)       Balance Overall balance assessment: Needs assistance   Sitting balance-Leahy Scale: Good       Standing balance-Leahy Scale: Fair Standing balance comment: relies on single UE support for dynamic standing                             Pertinent Vitals/Pain Pain Assessment: No/denies pain    Home Living Family/patient expects to be discharged to:: Private residence Living Arrangements: Spouse/significant other;Children Available Help at Discharge: Family;Available 24 hours/day   Home Access: Stairs to enter Entrance Stairs-Rails: Right Entrance Stairs-Number of Steps: 5 Home Layout: One level Home Equipment: None      Prior Function Level of Independence: Independent         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, learning disability        Extremity/Trunk Assessment   Upper Extremity Assessment Upper Extremity Assessment: Defer to OT evaluation    Lower Extremity Assessment Lower Extremity Assessment: Overall WFL for tasks assessed    Cervical / Trunk Assessment Cervical / Trunk Assessment: Normal  Communication   Communication: No difficulties  Cognition Arousal/Alertness: Awake/alert Behavior During Therapy: Flat affect Overall Cognitive Status: No family/caregiver present to determine baseline cognitive functioning  General Comments: flat affect, slow processing time      General Comments      Exercises     Assessment/Plan    PT Assessment Patient needs continued PT services  PT Problem List Decreased mobility;Decreased balance;Decreased knowledge of use of DME       PT Treatment Interventions Gait training;DME instruction;Stair training;Functional mobility training;Therapeutic  exercise;Therapeutic activities;Balance training;Patient/family education    PT Goals (Current goals can be found in the Care Plan section)  Acute Rehab PT Goals Patient Stated Goal: to be able to walk farther PT Goal Formulation: With patient Time For Goal Achievement: 01/26/19 Potential to Achieve Goals: Good    Frequency Min 3X/week   Barriers to discharge        Co-evaluation PT/OT/SLP Co-Evaluation/Treatment: Yes Reason for Co-Treatment: For patient/therapist safety PT goals addressed during session: Mobility/safety with mobility;Balance;Proper use of DME         AM-PAC PT "6 Clicks" Mobility  Outcome Measure Help needed turning from your back to your side while in a flat bed without using bedrails?: None Help needed moving from lying on your back to sitting on the side of a flat bed without using bedrails?: A Little Help needed moving to and from a bed to a chair (including a wheelchair)?: A Little Help needed standing up from a chair using your arms (e.g., wheelchair or bedside chair)?: A Little Help needed to walk in hospital room?: A Little Help needed climbing 3-5 steps with a railing? : A Lot 6 Click Score: 18    End of Session Equipment Utilized During Treatment: Gait belt Activity Tolerance: Patient tolerated treatment well Patient left: in chair;with chair alarm set(tele sitter on) Nurse Communication: Mobility status PT Visit Diagnosis: Unsteadiness on feet (R26.81);Ataxic gait (R26.0);Difficulty in walking, not elsewhere classified (R26.2)    Time: 2202-5427 PT Time Calculation (min) (ACUTE ONLY): 25 min   Charges:   PT Evaluation $PT Eval Low Complexity: 1 Low          Blondell Reveal Kistler PT 01/12/2019  Acute Rehabilitation Services Pager [phone] Office [phone]

## 2019-01-12 NOTE — Progress Notes (Signed)
RN reviewed discharge instructions with patient and husband.  Patient and family report having RW at home, and easily accessible while patient is still experiencing weakness.   Patient and family interested in inpatient or outpatient Rehab services. Information provided by CSW.   All other questions answered.   Paperwork given to patient.  Prescriptions sent to pharmacy by MD.    NT rolled patient down with all belongings to family car.

## 2019-01-16 ENCOUNTER — Encounter: Payer: Self-pay | Admitting: Physician Assistant

## 2019-01-18 ENCOUNTER — Telehealth: Payer: 59 | Admitting: Nurse Practitioner

## 2019-01-18 DIAGNOSIS — F1010 Alcohol abuse, uncomplicated: Secondary | ICD-10-CM

## 2019-01-18 NOTE — Progress Notes (Signed)
Based on what you shared with me it looks like you have situation,that should be evaluated in a face to face office visit. I am sorry but this is not something that can be done in an evisit. There are only certain doctors that rx naltrexone and I am not sure who they are. You can call Emergency Department and see if they can help you.    NOTE: If you entered your credit card information for this eVisit, you will not be charged. You may see a "hold" on your card for the $35 but that hold will drop off and you will not have a charge processed.  If you are having a true medical emergency please call 911.     For an urgent face to face visit, [facility] has four urgent care centers for your convenience:   . [facility]    [phone]                  Get Driving Directions  [address] [place], [place] [address] . 10 am to 8 pm Monday-Friday . 12 pm to 8 pm Saturday-Sunday   . [facility] at [facility]  [phone]                  Get Driving Directions  [address] [place], [place] [address] . 8 am to 8 pm Monday-Friday . 9 am to 6 pm Saturday . 11 am to 6 pm Sunday   . [facility] at [facility]  [phone]                  Get Driving Directions   [address] [place], [place] [address] . 8 am to 8 pm Monday-Friday . 8 am to 4 pm Saturday-Sunday    . [facility] Urgent Care at [facility] Directions  [phone]  [address], [address] [place], [place] [address]  . Monday-Friday, 12 PM to 6 PM    Your e-visit answers were reviewed by a board certified advanced clinical practitioner to complete your personal care plan.  Thank you for using e-Visits.

## 2019-02-28 DIAGNOSIS — M9903 Segmental and somatic dysfunction of lumbar region: Secondary | ICD-10-CM | POA: Diagnosis not present

## 2019-02-28 DIAGNOSIS — M9901 Segmental and somatic dysfunction of cervical region: Secondary | ICD-10-CM | POA: Diagnosis not present

## 2019-02-28 DIAGNOSIS — M9902 Segmental and somatic dysfunction of thoracic region: Secondary | ICD-10-CM | POA: Diagnosis not present

## 2019-02-28 DIAGNOSIS — M531 Cervicobrachial syndrome: Secondary | ICD-10-CM | POA: Diagnosis not present

## 2019-04-20 ENCOUNTER — Ambulatory Visit: Payer: 59

## 2019-05-08 ENCOUNTER — Other Ambulatory Visit: Payer: Self-pay

## 2019-05-08 ENCOUNTER — Encounter: Payer: Self-pay | Admitting: Family

## 2019-05-08 ENCOUNTER — Ambulatory Visit (INDEPENDENT_AMBULATORY_CARE_PROVIDER_SITE_OTHER): Payer: BC Managed Care – PPO | Admitting: Family

## 2019-05-08 VITALS — BP 119/61 | HR 91 | Temp 98.0°F | Resp 16 | Ht 67.0 in | Wt 127.0 lb

## 2019-05-08 DIAGNOSIS — F1020 Alcohol dependence, uncomplicated: Secondary | ICD-10-CM | POA: Diagnosis not present

## 2019-05-08 DIAGNOSIS — I10 Essential (primary) hypertension: Secondary | ICD-10-CM | POA: Diagnosis not present

## 2019-05-08 DIAGNOSIS — F1011 Alcohol abuse, in remission: Secondary | ICD-10-CM | POA: Diagnosis not present

## 2019-05-08 DIAGNOSIS — E559 Vitamin D deficiency, unspecified: Secondary | ICD-10-CM | POA: Diagnosis not present

## 2019-05-08 DIAGNOSIS — Z8719 Personal history of other diseases of the digestive system: Secondary | ICD-10-CM

## 2019-05-08 LAB — COMPREHENSIVE METABOLIC PANEL
ALT: 18 U/L (ref 0–35)
AST: 21 U/L (ref 0–37)
Albumin: 4.3 g/dL (ref 3.5–5.2)
Alkaline Phosphatase: 71 U/L (ref 39–117)
BUN: 9 mg/dL (ref 6–23)
CO2: 26 mEq/L (ref 19–32)
Calcium: 9.7 mg/dL (ref 8.4–10.5)
Chloride: 101 mEq/L (ref 96–112)
Creatinine, Ser: 0.74 mg/dL (ref 0.40–1.20)
GFR: 84.01 mL/min (ref >60.00)
Glucose, Bld: 94 mg/dL (ref 70–99)
Potassium: 4.7 mEq/L (ref 3.5–5.1)
Sodium: 136 mEq/L (ref 135–145)
Total Bilirubin: 0.9 mg/dL (ref 0.2–1.2)
Total Protein: 7.3 g/dL (ref 6.0–8.3)

## 2019-05-08 LAB — B12 AND FOLATE PANEL
Folate: >23.7 ng/mL (ref >5.9)
Vitamin B-12: >1500 pg/mL — ABNORMAL HIGH (ref 211–911)

## 2019-05-08 MED ORDER — THIAMINE HCL 100 MG PO TABS: 100.0000 mg | ORAL_TABLET | Freq: Every day | ORAL | Status: AC

## 2019-05-08 MED ORDER — TRAZODONE HCL 50 MG PO TABS: 50.0000 mg | ORAL_TABLET | Freq: Every evening | ORAL | 3 refills | Status: DC | PRN

## 2019-05-08 MED ORDER — B-12 1000 MCG PO TABS: 1.0000 | ORAL_TABLET | Freq: Every day | ORAL | Status: AC

## 2019-05-08 MED ORDER — FISH OIL 1000 MG PO CAPS: 1.0000 | ORAL_CAPSULE | Freq: Every day | ORAL | 0 refills | Status: DC

## 2019-05-08 MED ORDER — BACLOFEN 10 MG PO TABS: 10.0000 mg | ORAL_TABLET | Freq: Every day | ORAL | 0 refills | Status: DC

## 2019-05-08 MED ORDER — MAGNESIUM 300 MG PO CAPS: 1.0000 | ORAL_CAPSULE | Freq: Every day | ORAL | 0 refills | Status: AC

## 2019-05-08 MED ORDER — SUPER OMEGA 3 EPA/DHA 1000 MG PO CAPS: ORAL_CAPSULE | ORAL | Status: AC

## 2019-05-08 MED ORDER — NALTREXONE HCL 50 MG PO TABS: 50.0000 mg | ORAL_TABLET | Freq: Every day | ORAL | Status: DC

## 2019-05-08 NOTE — Progress Notes (Signed)
Subjective:    Patient ID: Tammy Tran, female    DOB: 1970-10-27, 49 y.o.   MRN: [id number]  HPI   Patient is a 49 yr old female who presents today to establish care. She has not had a PCP in many years.   Reviewed hospital discharge summary from January. She was admitted at that time or acute pancreatitis in the setting of ETOH abuse.   HTN- was discharged on amlodipine at the time of her hospitalization.  BP Readings from Last 3 Encounters:  05/08/19 119/61  01/12/19 (!) 119/95  11/18/15 110/78   She reports that she attended an alcohol treatment program in [place], [place] ([email or web address]). She is now 4 months sober.  She was sent home with baclofen, naltrexone, trazodone, gabapentin and vitamins. She does a zoom call with her therapist once a week. She is interested in checking her follow up labs.   Tobacco abuse- she is still smoking. Only smoking x 1.5 years. 2 cigarettes a day.      Review of Systems See HPI  Past Medical History:  Diagnosis Date  . Allergy    Year-round. Multiple environmental allergies.  . Chickenpox    5th grade     Social History   Socioeconomic History  . Marital status: Married    Spouse name: Not on file  . Number of children: 1  . Years of education: Not on file  . Highest education level: Not on file  Occupational History  . 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 Smoking status: Current Every Day Smoker    Packs/day: 0.25    Types: Cigarettes  . Smokeless tobacco: Never Used  Substance and Sexual Activity  . Alcohol use: Not Currently  . Drug use: Never  . Sexual activity: Yes    Partners: Male    Birth control/protection: I.U.D.    Comment: Mirena  Other Topics Concern  . 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   Works as a Engineer, production at the [employer]   1  biological son born 2000   2 step sons (one lives in the house)   Has one grandchildren   Enjoys spending time with her dogs- she has 3 dogs, shopping   Social Determinants of Corporate investment banker Strain:   . Difficulty of Paying Living Expenses:   Food Insecurity:   . Worried About Programme researcher, broadcasting/film/video in the Last Year:   . Barista in the Last Year:   Transportation Needs:   . Freight forwarder (Medical):   Marland Kitchen Lack of Transportation (Non-Medical):   Physical Activity: Sufficiently Active  . Days of Exercise per Week: 6 days  . Minutes of Exercise per Session: 60 min  Stress:   . Feeling of Stress :   Social Connections:   . Frequency of Communication with Friends and Family:   . Frequency of Social Gatherings with Friends and Family:   . Attends Religious Services:   . Active Member of Clubs or Organizations:   . Attends Banker Meetings:   Marland Kitchen Marital Status:   Intimate Partner Violence:   . Fear of Current or Ex-Partner:   . Emotionally Abused:   Marland Kitchen Physically Abused:   . Sexually Abused:  Past Surgical History:  Procedure Laterality Date  . CESAREAN SECTION  2000  . CESAREAN SECTION      Family History  Adopted: Yes    Allergies  Allergen Reactions  . Erythromycin Nausea Only  . Erythromycin Diarrhea and Nausea And Vomiting    Current Outpatient Medications on File Prior to Visit  Medication Sig Dispense Refill  . cetirizine (ZYRTEC) 10 MG tablet Take 10 mg by mouth daily.    . Cholecalciferol (VITAMIN D-3) 1000 units CAPS Take 1 capsule (1,000 Units total) by mouth daily. 30 capsule 0  . levonorgestrel (MIRENA) 20 MCG/24HR IUD 1 each by Intrauterine route once.     No current facility-administered medications on file prior to visit.    BP 119/61 (BP Location: Right Arm, Patient Position: Sitting, Cuff Size: Small)   Pulse 91   Temp 98 F (36.7 C) (Temporal)   Resp 16   Ht 5\' 7"  (1.702 m)   Wt 127 lb (57.6 kg)   SpO2  100%   BMI 19.89 kg/m       Objective:   Physical Exam Constitutional:      Appearance: She is well-developed.  Neck:     Thyroid: No thyromegaly.  Cardiovascular:     Rate and Rhythm: Normal rate and regular rhythm.     Heart sounds: Normal heart sounds. No murmur.  Pulmonary:     Effort: Pulmonary effort is normal. No respiratory distress.     Breath sounds: Normal breath sounds. No wheezing.  Abdominal:     General: Bowel sounds are normal. There is no distension.     Palpations: Abdomen is soft. There is no mass.  Musculoskeletal:     Cervical back: Neck supple.  Skin:    General: Skin is warm and dry.  Neurological:     Mental Status: She is alert and oriented to person, place, and time.  Psychiatric:        Behavior: Behavior normal.        Thought Content: Thought content normal.        Judgment: Judgment normal.           Assessment & Plan:  History of Alcohol abuse- she is doing well following her rehab. I have advised her to establish with a local psychiatrist. She will continue current meds including naltrexone. She states she does not need refills at this time. I advised her that I would be most comfortable with her having psychiatry continue to prescribe her naltrexone if they feel that it is indicated.  History of pancreatitis- follow up lipase and LFT's today have returned to normal.  Tobacco abuse- given that she is only smoking 1-2 cigarettes a day, I have suggested she try nicorette gum in place of the cigarettes to help with smoking cessation.  Vitamin D deficiency- check vit D level.   HTN- not on bp meds currently. BP stable today. Will monitor.  BP Readings from Last 3 Encounters:  05/08/19 119/61  01/12/19 (!) 119/95  11/18/15 110/78    45 minutes spent on today's visit.  Time was spent reviewing chart, interviewing and counseling and examining patient.  This visit occurred during the SARS-CoV-2 public health emergency.  Safety protocols  were in place, including screening questions prior to the visit, additional usage of staff PPE, and extensive cleaning of exam room while observing appropriate contact time as indicated for disinfecting solutions.

## 2019-05-08 NOTE — Patient Instructions (Signed)
Please schedule lab work prior to leaving. Schedule an appointment with psychiatry for ongoing management of your naltrexone. Continue your work with Photographer. Welcome to [facility]!   Psychiatric Services:  Dr. Ardeth Sportsman - [phone] [facility]) - [phone] [facility]) - [phone] Dr. Mil[facility]ros [facility]) [phone] [facility] [place]) [phone] [facility] [place]) - [phone] [facility]) - [phone] [facility], [address], [place], [place] [phone]

## 2019-05-09 DIAGNOSIS — Z8719 Personal history of other diseases of the digestive system: Secondary | ICD-10-CM | POA: Insufficient documentation

## 2019-05-09 DIAGNOSIS — F1011 Alcohol abuse, in remission: Secondary | ICD-10-CM | POA: Insufficient documentation

## 2019-05-09 DIAGNOSIS — E559 Vitamin D deficiency, unspecified: Secondary | ICD-10-CM | POA: Insufficient documentation

## 2019-05-11 LAB — VITAMIN D 1,25 DIHYDROXY
Vitamin D 1, 25 (OH)2 Total: 52 pg/mL (ref 18–72)
Vitamin D2 1, 25 (OH)2: <8 pg/mL
Vitamin D3 1, 25 (OH)2: 52 pg/mL

## 2019-06-06 ENCOUNTER — Ambulatory Visit (INDEPENDENT_AMBULATORY_CARE_PROVIDER_SITE_OTHER): Payer: BC Managed Care – PPO | Admitting: Family

## 2019-06-06 ENCOUNTER — Telehealth: Payer: Self-pay | Admitting: Family

## 2019-06-06 ENCOUNTER — Encounter: Payer: Self-pay | Admitting: Family

## 2019-06-06 ENCOUNTER — Other Ambulatory Visit: Payer: Self-pay

## 2019-06-06 VITALS — BP 102/64 | HR 86 | Temp 97.7°F | Resp 16 | Ht 67.0 in | Wt 128.0 lb

## 2019-06-06 DIAGNOSIS — Z0000 Encounter for general adult medical examination without abnormal findings: Secondary | ICD-10-CM

## 2019-06-06 LAB — CBC WITH DIFFERENTIAL/PLATELET
Basophils Absolute: 0 10*3/uL (ref 0.0–0.1)
Basophils Relative: 0.6 % (ref 0.0–3.0)
Eosinophils Absolute: 0.1 10*3/uL (ref 0.0–0.7)
Eosinophils Relative: 1.6 % (ref 0.0–5.0)
HCT: 40.1 % (ref 36.0–46.0)
Hemoglobin: 13.7 g/dL (ref 12.0–15.0)
Lymphocytes Relative: 31.5 % (ref 12.0–46.0)
Lymphs Abs: 2.5 10*3/uL (ref 0.7–4.0)
MCHC: 34.2 g/dL (ref 30.0–36.0)
MCV: 92.4 fl (ref 78.0–100.0)
Monocytes Absolute: 0.5 10*3/uL (ref 0.1–1.0)
Monocytes Relative: 6.2 % (ref 3.0–12.0)
Neutro Abs: 4.7 10*3/uL (ref 1.4–7.7)
Neutrophils Relative %: 60.1 % (ref 43.0–77.0)
Platelets: 262 10*3/uL (ref 150.0–400.0)
RBC: 4.34 Mil/uL (ref 3.87–5.11)
RDW: 13.3 % (ref 11.5–15.5)
WBC: 7.9 10*3/uL (ref 4.0–10.5)

## 2019-06-06 LAB — LIPID PANEL
Cholesterol: 224 mg/dL — ABNORMAL HIGH (ref 0–200)
HDL: 47.5 mg/dL (ref >39.00)
LDL Cholesterol: 163 mg/dL — ABNORMAL HIGH (ref 0–99)
NonHDL: 176.82
Total CHOL/HDL Ratio: 5
Triglycerides: 67 mg/dL (ref 0.0–149.0)
VLDL: 13.4 mg/dL (ref 0.0–40.0)

## 2019-06-06 LAB — TSH: TSH: 0.8 u[IU]/mL (ref 0.35–4.50)

## 2019-06-06 NOTE — Patient Instructions (Addendum)
Please complete lab work prior to leaving. Good luck quitting smoking!    Preventive Care 55-47 Years Old, Female Preventive care refers to visits with your health care provider and lifestyle choices that can promote health and wellness. This includes:  A yearly physical exam. This may also be called an annual well check.  Regular dental visits and eye exams.  Immunizations.  Screening for certain conditions.  Healthy lifestyle choices, such as eating a healthy diet, getting regular exercise, not using drugs or products that contain nicotine and tobacco, and limiting alcohol use. What can I expect for my preventive care visit? Physical exam Your health care provider will check your:  Height and weight. This may be used to calculate body mass index (BMI), which tells if you are at a healthy weight.  Heart rate and blood pressure.  Skin for abnormal spots. Counseling Your health care provider may ask you questions about your:  Alcohol, tobacco, and drug use.  Emotional well-being.  Home and relationship well-being.  Sexual activity.  Eating habits.  Work and work Statistician.  Method of birth control.  Menstrual cycle.  Pregnancy history. What immunizations do I need?  Influenza (flu) vaccine  This is recommended every year. Tetanus, diphtheria, and pertussis (Tdap) vaccine  You may need a Td booster every 10 years. Varicella (chickenpox) vaccine  You may need this if you have not been vaccinated. Zoster (shingles) vaccine  You may need this after age 40. Measles, mumps, and rubella (MMR) vaccine  You may need at least one dose of MMR if you were born in 1957 or later. You may also need a second dose. Pneumococcal conjugate (PCV13) vaccine  You may need this if you have certain conditions and were not previously vaccinated. Pneumococcal polysaccharide (PPSV23) vaccine  You may need one or two doses if you smoke cigarettes or if you have certain  conditions. Meningococcal conjugate (MenACWY) vaccine  You may need this if you have certain conditions. Hepatitis A vaccine  You may need this if you have certain conditions or if you travel or work in places where you may be exposed to hepatitis A. Hepatitis B vaccine  You may need this if you have certain conditions or if you travel or work in places where you may be exposed to hepatitis B. Haemophilus influenzae type b (Hib) vaccine  You may need this if you have certain conditions. Human papillomavirus (HPV) vaccine  If recommended by your health care provider, you may need three doses over 6 months. You may receive vaccines as individual doses or as more than one vaccine together in one shot (combination vaccines). Talk with your health care provider about the risks and benefits of combination vaccines. What tests do I need? Blood tests  Lipid and cholesterol levels. These may be checked every 5 years, or more frequently if you are over 47 years old.  Hepatitis C test.  Hepatitis B test. Screening  Lung cancer screening. You may have this screening every year starting at age 47 if you have a 30-pack-year history of smoking and currently smoke or have quit within the past 15 years.  Colorectal cancer screening. All adults should have this screening starting at age 27 and continuing until age 68. Your health care provider may recommend screening at age 23 if you are at increased risk. You will have tests every 1-10 years, depending on your results and the type of screening test.  Diabetes screening. This is done by checking your blood sugar (  glucose) after you have not eaten for a while (fasting). You may have this done every 1-3 years.  Mammogram. This may be done every 1-2 years. Talk with your health care provider about when you should start having regular mammograms. This may depend on whether you have a family history of breast cancer.  BRCA-related cancer screening. This  may be done if you have a family history of breast, ovarian, tubal, or peritoneal cancers.  Pelvic exam and Pap test. This may be done every 3 years starting at age 105. Starting at age 12, this may be done every 5 years if you have a Pap test in combination with an HPV test. Other tests  Sexually transmitted disease (STD) testing.  Bone density scan. This is done to screen for osteoporosis. You may have this scan if you are at high risk for osteoporosis. Follow these instructions at home: Eating and drinking  Eat a diet that includes fresh fruits and vegetables, whole grains, lean protein, and low-fat dairy.  Take vitamin and mineral supplements as recommended by your health care provider.  Do not drink alcohol if: ? Your health care provider tells you not to drink. ? You are pregnant, may be pregnant, or are planning to become pregnant.  If you drink alcohol: ? Limit how much you have to 0-1 drink a day. ? Be aware of how much alcohol is in your drink. In the [place] one drink equals one 12 oz bottle of beer (355 mL), one 5 oz glass of wine (148 mL), or one 1 oz glass of hard liquor (44 mL). Lifestyle  Take daily care of your teeth and gums.  Stay active. Exercise for at least 30 minutes on 5 or more days each week.  Do not use any products that contain nicotine or tobacco, such as cigarettes, e-cigarettes, and chewing tobacco. If you need help quitting, ask your health care provider.  If you are sexually active, practice safe sex. Use a condom or other form of birth control (contraception) in order to prevent pregnancy and STIs (sexually transmitted infections).  If told by your health care provider, take low-dose aspirin daily starting at age 53. What's next?  Visit your health care provider once a year for a well check visit.  Ask your health care provider how often you should have your eyes and teeth checked.  Stay up to date on all vaccines. This information is not  intended to replace advice given to you by your health care provider. Make sure you discuss any questions you have with your health care provider. Document Revised: 09/02/2017 Document Reviewed: 09/02/2017 Elsevier Patient Education  2020 [employer].

## 2019-06-06 NOTE — Progress Notes (Signed)
Subjective:    Patient ID: Tammy Tran, female    DOB: [birth year], 49 y.o.   MRN: [id number]  HPI  Patient presents today for complete physical.  Immunizations: completed pfizer vaccine series, due for tetanus Diet: healthy Exercise: regular Pap Smear:  10/21 (Dr. Malva Limes) Mammogram: up to date at GYN office.  Vision: coming up Dental: due, plans to schedule Tobacco abuse- working with [facility]- sent patches and nicotine gum, coach.  Plans to start the patch this week.    Review of Systems  Constitutional: Negative for unexpected weight change.  HENT: Negative for hearing loss and rhinorrhea.   Eyes: Negative for visual disturbance.  Respiratory: Negative for cough and shortness of breath.   Cardiovascular: Negative for chest pain.  Gastrointestinal: Negative for constipation and diarrhea.  Genitourinary: Negative for dysuria, frequency, hematuria and menstrual problem (has a mirena, no periods).  Musculoskeletal: Negative for arthralgias and myalgias.  Skin: Negative for rash.  Neurological: Negative for headaches.  Hematological: Negative for adenopathy.  Psychiatric/Behavioral:       Denies depression/anxiety symptoms   Past Medical History:  Diagnosis Date  . Allergy    Year-round. Multiple environmental allergies.  . Chickenpox    5th grade     Social History   Socioeconomic History  . Marital status: Married    Spouse name: Not on file  . Number of children: 1  . Years of education: Not on file  . Highest education level: Not on file  Occupational History  . 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 Smoking status: Current Every Day Smoker    Packs/day: 0.25    Types: Cigarettes  . Smokeless tobacco: Never Used  Substance and Sexual Activity  . Alcohol use: Not Currently  . Drug use: Never  . Sexual activity: Yes    Partners: Male    Birth control/protection: I.U.D.    Comment: Mirena  Other Topics Concern  . 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   Works as a Engineer, production at the [employer]   1 biological son born 2000   2 step sons (one lives in the house)   Has one grandchildren   Enjoys spending time with her dogs- she has 3 dogs, shopping   Social Determinants of Corporate investment banker Strain:   . Difficulty of Paying Living Expenses:   Food Insecurity:   . Worried About Programme researcher, broadcasting/film/video in the Last Year:   . Barista in the Last Year:   Transportation Needs:   . Freight forwarder (Medical):   Marland Kitchen Lack of Transportation (Non-Medical):   Physical Activity: Sufficiently Active  . Days of Exercise per Week: 6 days  . Minutes of Exercise per Session: 60 min  Stress:   . Feeling of Stress :   Social Connections:   . Frequency of Communication with Friends and Family:   . Frequency of Social Gatherings with Friends and Family:   . Attends Religious Services:   . Active Member of Clubs or Organizations:   . Attends Banker Meetings:   Marland Kitchen Marital Status:   Intimate Partner Violence:   . Fear of Current or Ex-Partner:   . Emotionally Abused:   Marland Kitchen Physically Abused:   . Sexually Abused:  Past Surgical History:  Procedure Laterality Date  . CESAREAN SECTION  2000  . CESAREAN SECTION      Family History  Adopted: Yes    Allergies  Allergen Reactions  . Erythromycin Nausea Only  . Erythromycin Diarrhea and Nausea And Vomiting    Current Outpatient Medications on File Prior to Visit  Medication Sig Dispense Refill  . baclofen (LIORESAL) 10 MG tablet Take 1 tablet (10 mg total) by mouth daily. 30 each 0  . cetirizine (ZYRTEC) 10 MG tablet Take 10 mg by mouth daily.    . Cholecalciferol (VITAMIN D-3) 1000 units CAPS Take 1 capsule (1,000 Units total) by mouth daily. 30 capsule 0  . Cyanocobalamin (B-12) 1000 MCG TABS Take  1 tablet (1,000 mcg total) by mouth daily. 30 tablet   . levonorgestrel (MIRENA) 20 MCG/24HR IUD 1 each by Intrauterine route once.    . Magnesium 300 MG CAPS Take 1 capsule (300 mg total) by mouth daily.  0  . naltrexone (DEPADE) 50 MG tablet Take 1 tablet (50 mg total) by mouth daily.    . Omega-3 Fatty Acids (SUPER OMEGA 3 EPA/DHA) 1000 MG CAPS Take 2 caps by mouth once daily. 60 capsule   . thiamine 100 MG tablet Take 1 tablet (100 mg total) by mouth daily.    . traZODone (DESYREL) 50 MG tablet Take 1 tablet (50 mg total) by mouth at bedtime as needed for sleep. 30 tablet 3   No current facility-administered medications on file prior to visit.    BP 102/64 (BP Location: Right Arm, Patient Position: Sitting, Cuff Size: Small)   Pulse 86   Temp 97.7 F (36.5 C) (Temporal)   Resp 16   Ht 5\' 7"  (1.702 m)   Wt 128 lb (58.1 kg)   SpO2 100%   BMI 20.05 kg/m        Past Medical History:  Diagnosis Date  . Allergy    Year-round. Multiple environmental allergies.  . Chickenpox    5th grade     Social History   Socioeconomic History  . Marital status: Married    Spouse name: Not on file  . Number of children: 1  . Years of education: Not on file  . Highest education level: Not on file  Occupational History  . 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 Smoking status: Current Every Day Smoker    Packs/day: 0.25    Types: Cigarettes  . Smokeless tobacco: Never Used  Substance and Sexual Activity  . Alcohol use: Not Currently  . Drug use: Never  . Sexual activity: Yes    Partners: Male    Birth control/protection: I.U.D.    Comment: Mirena  Other Topics Concern  . 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 Difficulty of Paying Living Expenses:   Food Insecurity:   . Worried About Corporate investment banker in the Last Year:   . Programme researcher, broadcasting/film/video in  the Last Year:   Transportation Needs:   . Film/video editor (Medical):   Marland Kitchen Lack of Transportation (Non-Medical):   Physical Activity: Sufficiently Active  . Days of Exercise per Week: 6 days  . Minutes of Exercise per Session: 60 min  Stress:   . Feeling of Stress :   Social Connections:   . Frequency of Communication with Friends and Family:   . Frequency of Social Gatherings with Friends and Family:   . Attends Religious Services:   . Active Member of Clubs or Organizations:   . Attends Archivist Meetings:   Marland Kitchen Marital Status:   Intimate Partner Violence:   . Fear of Current or Ex-Partner:   . Emotionally Abused:   Marland Kitchen Physically Abused:   . Sexually Abused:     Past Surgical History:  Procedure Laterality Date  . CESAREAN SECTION  2000  . CESAREAN SECTION      Family History  Adopted: Yes    Allergies  Allergen Reactions  . Erythromycin Nausea Only  . Erythromycin Diarrhea and Nausea And Vomiting    Current Outpatient Medications on File Prior to Visit  Medication Sig Dispense Refill  . baclofen (LIORESAL) 10 MG tablet Take 1 tablet (10 mg total) by mouth daily. 30 each 0  . cetirizine (ZYRTEC) 10 MG tablet Take 10 mg by mouth daily.    . Cholecalciferol (VITAMIN D-3) 1000 units CAPS Take 1 capsule (1,000 Units total) by mouth daily. 30 capsule 0  . Cyanocobalamin (B-12) 1000 MCG TABS Take 1 tablet (1,000 mcg total) by mouth daily. 30 tablet   . levonorgestrel (MIRENA) 20 MCG/24HR IUD 1 each by Intrauterine route once.    . Magnesium 300 MG CAPS Take 1 capsule (300 mg total) by mouth daily.  0  . naltrexone (DEPADE) 50 MG tablet Take 1 tablet (50 mg total) by  mouth daily.    . Omega-3 Fatty Acids (SUPER OMEGA 3 EPA/DHA) 1000 MG CAPS Take 2 caps by mouth once daily. 60 capsule   . thiamine 100 MG tablet Take 1 tablet (100 mg total) by mouth daily.    . traZODone (DESYREL) 50 MG tablet Take 1 tablet (50 mg total) by mouth at bedtime as needed for sleep. 30 tablet 3   No current facility-administered medications on file prior to visit.    BP 102/64 (BP Location: Right Arm, Patient Position: Sitting, Cuff Size: Small)   Pulse 86   Temp 97.7 F (36.5 C) (Temporal)   Resp 16   Ht 5\' 7"  (1.702 m)   Wt 128 lb (58.1 kg)   SpO2 100%   BMI 20.05 kg/m    Objective:   Physical Exam  Physical Exam  Constitutional: She is oriented to person, place, and time. She appears well-developed and well-nourished. No distress.  HENT:  Head: Normocephalic and atraumatic.  Right Ear: Tympanic membrane and ear canal normal.  Left Ear: Tympanic membrane and ear canal normal.  Mouth/Throat: Not examined- pt wearing mask Eyes: Pupils are equal, round, and reactive to light. No scleral icterus.  Neck: Normal range of motion. No thyromegaly present.  Cardiovascular: Normal rate and regular rhythm.   No murmur heard. Pulmonary/Chest: Effort normal and breath sounds normal. No respiratory distress. He has no wheezes. She has no rales. She exhibits no tenderness.  Abdominal: Soft. Bowel sounds are normal. She exhibits no distension and no mass. There is no tenderness. There is no rebound and no guarding.  Musculoskeletal: She exhibits no edema.  Lymphadenopathy:    She has no cervical adenopathy.  Neurological: She is alert and oriented to person, place, and time. She has normal patellar reflexes. She exhibits normal muscle tone. Coordination normal.  Skin: Skin is warm and dry.  Psychiatric: She has a normal mood and affect. Her behavior is normal. Judgment and thought content normal.  Breasts: Examined lying Right: Without masses, retractions, discharge or  axillary adenopathy.  Left: Without masses, retractions, discharge or axillary adenopathy.  Pelvic: deferred        Assessment & Plan:   Preventative care- encouraged pt to continue healthy diet, regular exercise.  She remains alcohol free for 5 months. Encouraged her to continue to work on her smoking cessation.  She is due for a tetanus but is only 1 week out from her covid vaccine. Plan tetanus next visit.  Obtain routine lab work. Will request pap and mammo report from her GYN office.  This visit occurred during the SARS-CoV-2 public health emergency.  Safety protocols were in place, including screening questions prior to the visit, additional usage of staff PPE, and extensive cleaning of exam room while observing appropriate contact time as indicated for disinfecting solutions.       Assessment & Plan:

## 2019-06-06 NOTE — Telephone Encounter (Signed)
Please call Dr. Malva Limes GYN, and request pap and mammo records.

## 2019-06-06 NOTE — Telephone Encounter (Signed)
Records release request faxed to [facility] 

## 2019-06-07 ENCOUNTER — Encounter: Payer: Self-pay | Admitting: Family

## 2019-11-15 DIAGNOSIS — Z20822 Contact with and (suspected) exposure to covid-19: Secondary | ICD-10-CM | POA: Diagnosis not present

## 2019-12-06 ENCOUNTER — Ambulatory Visit: Payer: BC Managed Care – PPO | Admitting: Family

## 2019-12-18 ENCOUNTER — Ambulatory Visit (INDEPENDENT_AMBULATORY_CARE_PROVIDER_SITE_OTHER): Payer: BC Managed Care – PPO | Admitting: Family

## 2019-12-18 ENCOUNTER — Encounter: Payer: Self-pay | Admitting: Family

## 2019-12-18 ENCOUNTER — Other Ambulatory Visit: Payer: Self-pay

## 2019-12-18 VITALS — BP 112/68 | HR 88 | Temp 98.6°F | Resp 16 | Wt 136.0 lb

## 2019-12-18 DIAGNOSIS — F1011 Alcohol abuse, in remission: Secondary | ICD-10-CM

## 2019-12-18 DIAGNOSIS — G4700 Insomnia, unspecified: Secondary | ICD-10-CM | POA: Diagnosis not present

## 2019-12-18 DIAGNOSIS — Z23 Encounter for immunization: Secondary | ICD-10-CM

## 2019-12-18 DIAGNOSIS — Z720 Tobacco use: Secondary | ICD-10-CM

## 2019-12-18 DIAGNOSIS — Z7185 Encounter for immunization safety counseling: Secondary | ICD-10-CM | POA: Diagnosis not present

## 2019-12-18 MED ORDER — TRAZODONE HCL 50 MG PO TABS: 50.0000 mg | ORAL_TABLET | Freq: Every evening | ORAL | 1 refills | Status: DC | PRN

## 2019-12-18 MED ORDER — MULTI-VITAMIN/MINERALS PO TABS: 1.0000 | ORAL_TABLET | Freq: Every day | ORAL | Status: AC

## 2019-12-18 NOTE — Addendum Note (Signed)
Addended by: Wilford Corner on: 12/18/2019 11:53 AM   Modules accepted: Orders

## 2019-12-18 NOTE — Progress Notes (Signed)
Subjective:    Patient ID: Tammy Tran, female    DOB: 10-17-1970, 49 y.o.   MRN: [id number]  HPI  Patient is a 49 yr old female who presents today for follow up.  Tobacco abuse- 1/2 PPD, no vaping.    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  She states she has a good support system.  Lab Results  Component Value Date   TSH 0.80 06/06/2019   Working with a Health and safety inspector.  She is on a strict diet. Lab Results  Component Value Date   CHOL 224 (H) 06/06/2019   HDL 47.50 06/06/2019   LDLCALC 163 (H) 06/06/2019   TRIG 67.0 06/06/2019   CHOLHDL 5 06/06/2019   Insomnia- uses trazodone 50mg  PO HS prn.  Reports that this really helps her to sleep.    Review of Systems    see HPI  Past Medical History:  Diagnosis Date  . Allergy    Year-round. Multiple environmental allergies.  . Chickenpox    5th grade     Social History   Socioeconomic History  . Marital status: Married    Spouse name: Not on file  . Number of children: 1  . Years of education: Not on file  . Highest education level: Not on file  Occupational History  . 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 Smoking status: Current Every Day Smoker    Packs/day: 0.25    Types: Cigarettes  . Smokeless tobacco: Never Used  Substance and Sexual Activity  . Alcohol use: Not Currently  . Drug use: Never  . Sexual activity: Yes    Partners: Male    Birth control/protection: I.U.D.    Comment: Mirena  Other Topics Concern  . 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 Days of Exercise per Week: 6 days  . Minutes of Exercise per Session: 60 min  Stress: Not on file  Social Connections: Not on file  Intimate Partner Violence: Not on file    Past Surgical History:  Procedure Laterality Date  . CESAREAN SECTION  2000  . CESAREAN SECTION      Family History  Adopted: Yes    Allergies  Allergen Reactions  . Erythromycin Nausea Only  . Erythromycin Diarrhea and Nausea And Vomiting    Current Outpatient Medications on File Prior to Visit  Medication Sig Dispense Refill  . baclofen (LIORESAL) 10 MG tablet Take 1 tablet (10 mg total) by mouth daily. 30 each 0  . cetirizine (ZYRTEC) 10 MG tablet Take 10 mg by mouth daily.    . Cholecalciferol (VITAMIN D-3) 1000 units CAPS Take 1 capsule (1,000 Units total) by mouth daily. 30 capsule 0  . Cyanocobalamin (B-12) 1000 MCG TABS Take 1 tablet (1,000 mcg total) by mouth daily.  30 tablet   . levonorgestrel (MIRENA) 20 MCG/24HR IUD 1 each by Intrauterine route once.    . Magnesium 300 MG CAPS Take 1 capsule (300 mg total) by mouth daily.  0  . naltrexone (DEPADE) 50 MG tablet Take 1 tablet (50 mg total) by mouth daily.    . Omega-3 Fatty Acids (SUPER OMEGA 3 EPA/DHA) 1000 MG CAPS Take 2 caps by mouth once daily. 60 capsule   . thiamine 100 MG tablet Take 1 tablet (100 mg total) by mouth daily.    . traZODone (DESYREL) 50 MG tablet Take 1 tablet (50 mg total) by mouth at bedtime as needed for sleep. 30 tablet 3   No current facility-administered medications on file prior to visit.    BP 112/68 (BP Location: Right Arm, Patient Position: Sitting, Cuff Size: Small)   Pulse 88   Temp 98.6 F (37 C) (Oral)   Resp 16   Wt 136 lb (61.7 kg)   SpO2 100%   BMI 21.30 kg/m    Objective:   Physical Exam Constitutional:      Appearance: She is well-developed  and well-nourished.  Cardiovascular:     Rate and Rhythm: Normal rate and regular rhythm.     Heart sounds: Normal heart sounds. No murmur heard.   Pulmonary:     Effort: Pulmonary effort is normal. No respiratory distress.     Breath sounds: Normal breath sounds. No wheezing.  Psychiatric:        Mood and Affect: Mood and affect normal.        Behavior: Behavior normal.        Thought Content: Thought content normal.        Judgment: Judgment normal.           Assessment & Plan:  Tobacco abuse- encouraged cessation.  Hx of alcohol abuse- remains in remission.  I congratulated her on her success.   Immunization counseling- recommended that she obtain a covid booster.  She is agreeable. She declines flu shot however. Tdap due- given today.   Insomnia- stable with prn trazodone. Continue same.  This visit occurred during the SARS-CoV-2 public health emergency.  Safety protocols were in place, including screening questions prior to the visit, additional usage of staff PPE, and extensive cleaning of exam room while observing appropriate contact time as indicated for disinfecting solutions.

## 2019-12-18 NOTE — Patient Instructions (Signed)
Keep up the good work with healthy diet, exercise. Please work on quitting smoking.  Congratulations on your ongoing recovery.

## 2019-12-20 DIAGNOSIS — Z1231 Encounter for screening mammogram for malignant neoplasm of breast: Secondary | ICD-10-CM | POA: Diagnosis not present

## 2019-12-20 DIAGNOSIS — Z124 Encounter for screening for malignant neoplasm of cervix: Secondary | ICD-10-CM | POA: Diagnosis not present

## 2019-12-20 DIAGNOSIS — Z01419 Encounter for gynecological examination (general) (routine) without abnormal findings: Secondary | ICD-10-CM | POA: Diagnosis not present

## 2019-12-20 LAB — HM MAMMOGRAPHY

## 2019-12-21 ENCOUNTER — Encounter: Payer: Self-pay | Admitting: Family

## 2020-01-29 ENCOUNTER — Encounter: Payer: Self-pay | Admitting: Family

## 2020-06-07 ENCOUNTER — Ambulatory Visit (INDEPENDENT_AMBULATORY_CARE_PROVIDER_SITE_OTHER): Payer: BC Managed Care – PPO | Admitting: Family

## 2020-06-07 ENCOUNTER — Encounter: Payer: Self-pay | Admitting: *Deleted

## 2020-06-07 ENCOUNTER — Encounter: Payer: BC Managed Care – PPO | Admitting: Family

## 2020-06-07 ENCOUNTER — Encounter: Payer: Self-pay | Admitting: Family

## 2020-06-07 ENCOUNTER — Telehealth: Payer: Self-pay | Admitting: Family

## 2020-06-07 ENCOUNTER — Other Ambulatory Visit: Payer: Self-pay

## 2020-06-07 VITALS — BP 110/64 | HR 74 | Temp 97.4°F | Resp 16 | Ht 67.0 in | Wt 137.0 lb

## 2020-06-07 DIAGNOSIS — E785 Hyperlipidemia, unspecified: Secondary | ICD-10-CM

## 2020-06-07 DIAGNOSIS — Z0000 Encounter for general adult medical examination without abnormal findings: Secondary | ICD-10-CM | POA: Diagnosis not present

## 2020-06-07 DIAGNOSIS — Z862 Personal history of diseases of the blood and blood-forming organs and certain disorders involving the immune mechanism: Secondary | ICD-10-CM | POA: Diagnosis not present

## 2020-06-07 LAB — CBC WITH DIFFERENTIAL/PLATELET
Basophils Absolute: 0.1 10*3/uL (ref 0.0–0.1)
Basophils Relative: 0.8 % (ref 0.0–3.0)
Eosinophils Absolute: 0.2 10*3/uL (ref 0.0–0.7)
Eosinophils Relative: 2.2 % (ref 0.0–5.0)
HCT: 39.7 % (ref 36.0–46.0)
Hemoglobin: 13.4 g/dL (ref 12.0–15.0)
Lymphocytes Relative: 28.6 % (ref 12.0–46.0)
Lymphs Abs: 2 10*3/uL (ref 0.7–4.0)
MCHC: 33.8 g/dL (ref 30.0–36.0)
MCV: 91.4 fl (ref 78.0–100.0)
Monocytes Absolute: 0.5 10*3/uL (ref 0.1–1.0)
Monocytes Relative: 7.9 % (ref 3.0–12.0)
Neutro Abs: 4.2 10*3/uL (ref 1.4–7.7)
Neutrophils Relative %: 60.5 % (ref 43.0–77.0)
Platelets: 295 10*3/uL (ref 150.0–400.0)
RBC: 4.34 Mil/uL (ref 3.87–5.11)
RDW: 12.3 % (ref 11.5–15.5)
WBC: 6.9 10*3/uL (ref 4.0–10.5)

## 2020-06-07 MED ORDER — TRAZODONE HCL 50 MG PO TABS: 50.0000 mg | ORAL_TABLET | Freq: Every evening | ORAL | 1 refills | Status: DC | PRN

## 2020-06-07 NOTE — Telephone Encounter (Signed)
Request has been sent

## 2020-06-07 NOTE — Assessment & Plan Note (Signed)
Encouraged pt to continue healthy diet, regular exercise.  She is down to 1 cigarette every other day. I encouraged her to work on complete cessation and she is motivated to do so.  Labs as ordered. Mammo up to date, will request from her GYN.  Pap up to date. Refer for colonoscopy. Recommended that she receive her covid booster shot.

## 2020-06-07 NOTE — Patient Instructions (Addendum)
Please complete lab work prior to leaving.  Continue healthy diet and regular exercise.  Good luck quitting smoking and have a safe trip!

## 2020-06-07 NOTE — Telephone Encounter (Signed)
Please call [facility] and request copy of mammogram.

## 2020-06-07 NOTE — Progress Notes (Signed)
Subjective:   By signing my name below, I, Shehryar Baig, attest that this documentation has been prepared under the direction and in the presence of Sandford Craze NP. 06/07/2020      Patient ID: Tammy Tran, female    DOB: 02-Nov-1970, 50 y.o.   MRN: [id number]  Chief Complaint  Patient presents with  . Annual Exam    HPI Patient is in today for a comprehensive physical exam. There have been no recent changes to her family medical history. She only smokes 1 cigarette daily. She is 17 months sober from alcohol. She occasionally smokes her husbands vape. She has a history of anemia. Sleep- She is requesting a refill on 50 mg trazodone daily PO. She reports that its less effective in helping her sleep. Weight- Her weight is stable.  Immunizations: She has 2 Covid-19 vaccines but has not taken the booster vaccine. She is UTD on tetanus vaccines. Diet: She manages a healthy diet. Exercise: She participates in moderate exercise daily. Colonoscopy: Due. Pap Smear: Last completed 12/07/2018. Results normal. Repeat in 3 years. Mammogram: Last completed 2021 at her clinic on [address]. Dental: She is UTD on dental care. Vision: She is UTD on vision care.   Health Maintenance Due  Topic Date Due  . Pneumococcal Vaccine 52-22 Years old (1 of 2 - PPSV23) Never done  . COLONOSCOPY (Pts 45-20yrs Insurance coverage will need to be confirmed)  Never done  . COVID-19 Vaccine (3 - Booster for [employer] series) 10/30/2019    Past Medical History:  Diagnosis Date  . Allergy    Year-round. Multiple environmental allergies.  . Chickenpox    5th grade    Past Surgical History:  Procedure Laterality Date  . CESAREAN SECTION  2000  . CESAREAN SECTION      Family History  Adopted: Yes    Social History   Socioeconomic History  . Marital status: Married    Spouse name: Not on file  . Number of children: 1  . Years of education: Not on file  . Highest education level: Not on  file  Occupational History  . 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 Smoking status: Current Every Day Smoker    Packs/day: 0.25    Types: Cigarettes  . Smokeless tobacco: Never Used  Substance and Sexual Activity  . Alcohol use: Not Currently  . Drug use: Never  . Sexual activity: Yes    Partners: Male    Birth control/protection: I.U.D.    Comment: Mirena  Other Topics Concern  . 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   Works as a Engineer, production at the [employer]   1 biological son born 2000   2 step sons (one lives in the house)   Has one grandchildren   Enjoys spending time with her dogs- she has 3 dogs, shopping   Social Determinants of Corporate investment banker Strain: Not on file  Food Insecurity: Not on file  Transportation Needs: Not on file  Physical Activity: Not on file  Stress: Not on file  Social Connections: Not on file  Intimate Partner Violence: Not on file    Outpatient Medications Prior to Visit  Medication Sig Dispense Refill  . cetirizine (ZYRTEC) 10 MG tablet Take 10 mg by mouth  daily.    . Cholecalciferol (VITAMIN D-3) 1000 units CAPS Take 1 capsule (1,000 Units total) by mouth daily. 30 capsule 0  . Cyanocobalamin (B-12) 1000 MCG TABS Take 1 tablet (1,000 mcg total) by mouth daily. 30 tablet   . levonorgestrel (MIRENA) 20 MCG/24HR IUD 1 each by Intrauterine route once.    . Magnesium 300 MG CAPS Take 1 capsule (300 mg total) by mouth daily.  0  . Multiple Vitamins-Minerals (MULTIVITAMIN WITH MINERALS) tablet Take 1 tablet by mouth daily.    . Omega-3 Fatty Acids (SUPER OMEGA 3 EPA/DHA) 1000 MG CAPS Take 2 caps by mouth once daily. 60 capsule   . thiamine 100 MG tablet Take 1 tablet (100 mg total) by mouth daily.    . traZODone (DESYREL) 50 MG tablet Take 1 tablet (50 mg total) by mouth at  bedtime as needed for sleep. 90 tablet 1   No facility-administered medications prior to visit.    Allergies  Allergen Reactions  . Erythromycin Nausea Only  . Erythromycin Diarrhea and Nausea And Vomiting    ROS     Objective:    Physical Exam Constitutional:      General: She is not in acute distress.    Appearance: Normal appearance. She is not ill-appearing.  HENT:     Head: Normocephalic and atraumatic.     Right Ear: Tympanic membrane and external ear normal.     Left Ear: Tympanic membrane and external ear normal.  Eyes:     Extraocular Movements: Extraocular movements intact.     Pupils: Pupils are equal, round, and reactive to light.     Comments: No nystagmus  Cardiovascular:     Rate and Rhythm: Normal rate and regular rhythm.     Pulses: Normal pulses.     Heart sounds: Normal heart sounds. No murmur heard. No gallop.   Pulmonary:     Effort: Pulmonary effort is normal. No respiratory distress.     Breath sounds: Normal breath sounds. No wheezing, rhonchi or rales.  Abdominal:     General: Bowel sounds are normal. There is no distension.     Palpations: Abdomen is soft. There is no mass.     Tenderness: There is no abdominal tenderness. There is no guarding or rebound.     Hernia: No hernia is present.  Musculoskeletal:     Comments: 5/5 strength in both upper and lower extremities.  Lymphadenopathy:     Cervical: No cervical adenopathy.  Skin:    General: Skin is warm and dry.  Neurological:     Mental Status: She is alert and oriented to person, place, and time.     Deep Tendon Reflexes:     Reflex Scores:      Patellar reflexes are 2+ on the right side and 2+ on the left side. Psychiatric:        Behavior: Behavior normal.     BP 110/64 (BP Location: Left Arm, Patient Position: Sitting, Cuff Size: Normal)   Pulse 74   Temp (!) 97.4 F (36.3 C)   Resp 16   Ht 5\' 7"  (1.702 m)   Wt 137 lb (62.1 kg)   SpO2 99%   BMI 21.46 kg/m  Wt Readings  from Last 3 Encounters:  06/07/20 137 lb (62.1 kg)  12/18/19 136 lb (61.7 kg)  06/06/19 128 lb (58.1 kg)       Assessment & Plan:   Problem List Items Addressed This Visit   None  No orders of the defined types were placed in this encounter.   I, Sandford Craze NP, personally preformed the services described in this documentation.  All medical record entries made by the scribe were at my direction and in my presence.  I have reviewed the chart and discharge instructions (if applicable) and agree that the record reflects my personal performance and is accurate and complete. 06/07/2020   I,Shehryar Baig,acting as a Neurosurgeon for Lemont Fillers, NP.,have documented all relevant documentation on the behalf of Lemont Fillers, NP,as directed by  Lemont Fillers, NP while in the presence of Lemont Fillers, NP.   Shehryar [employer]

## 2020-06-10 LAB — COMPREHENSIVE METABOLIC PANEL
ALT: 12 U/L (ref 0–35)
AST: 14 U/L (ref 0–37)
Albumin: 4.4 g/dL (ref 3.5–5.2)
Alkaline Phosphatase: 64 U/L (ref 39–117)
BUN: 11 mg/dL (ref 6–23)
CO2: 26 mEq/L (ref 19–32)
Calcium: 9.2 mg/dL (ref 8.4–10.5)
Chloride: 102 mEq/L (ref 96–112)
Creatinine, Ser: 0.83 mg/dL (ref 0.40–1.20)
GFR: 83.38 mL/min (ref >60.00)
Glucose, Bld: 97 mg/dL (ref 70–99)
Potassium: 4.3 mEq/L (ref 3.5–5.1)
Sodium: 136 mEq/L (ref 135–145)
Total Bilirubin: 0.4 mg/dL (ref 0.2–1.2)
Total Protein: 7.2 g/dL (ref 6.0–8.3)

## 2020-06-10 LAB — LIPID PANEL
Cholesterol: 262 mg/dL — ABNORMAL HIGH (ref 0–200)
HDL: 50.8 mg/dL (ref >39.00)
LDL Cholesterol: 197 mg/dL — ABNORMAL HIGH (ref 0–99)
NonHDL: 210.79
Total CHOL/HDL Ratio: 5
Triglycerides: 69 mg/dL (ref 0.0–149.0)
VLDL: 13.8 mg/dL (ref 0.0–40.0)

## 2020-08-28 ENCOUNTER — Other Ambulatory Visit: Payer: Self-pay

## 2020-08-28 ENCOUNTER — Ambulatory Visit (AMBULATORY_SURGERY_CENTER): Payer: BC Managed Care – PPO | Admitting: *Deleted

## 2020-08-28 VITALS — Ht 67.0 in | Wt 135.0 lb

## 2020-08-28 DIAGNOSIS — Z1211 Encounter for screening for malignant neoplasm of colon: Secondary | ICD-10-CM

## 2020-08-28 NOTE — Progress Notes (Signed)
Pt's previsit is done over the phone and all paperwork (prep instructions, blank consent form to just read over) sent to patient.  Pt's name and DOB verified at the beginning of the previsit.  Pt denies any difficulty with ambulating.    No trouble with anesthesia, denies moving neck   No egg or soy allergy  No home oxygen use   No medications for weight loss taken  emmi information given  Pt denies constipation issues

## 2020-09-16 ENCOUNTER — Encounter: Payer: Self-pay | Admitting: Gastroenterology

## 2020-09-19 ENCOUNTER — Encounter: Payer: BC Managed Care – PPO | Admitting: Gastroenterology

## 2020-09-24 ENCOUNTER — Encounter: Payer: BC Managed Care – PPO | Admitting: Gastroenterology

## 2020-12-08 ENCOUNTER — Other Ambulatory Visit: Payer: Self-pay | Admitting: Family

## 2021-04-14 LAB — HM PAP SMEAR: HPV, high-risk: NEGATIVE

## 2021-05-15 ENCOUNTER — Other Ambulatory Visit: Payer: Self-pay | Admitting: Family

## 2021-06-09 ENCOUNTER — Observation Stay (HOSPITAL_COMMUNITY)
Admission: EM | Admit: 2021-06-09 | Discharge: 2021-06-11 | Disposition: A | Discharge status: Home / Self Care | Payer: Commercial Managed Care - PPO | Attending: Emergency Medicine | Admitting: Emergency Medicine

## 2021-06-09 ENCOUNTER — Emergency Department (HOSPITAL_COMMUNITY): Payer: Commercial Managed Care - PPO

## 2021-06-09 ENCOUNTER — Other Ambulatory Visit: Payer: Self-pay

## 2021-06-09 ENCOUNTER — Emergency Department (EMERGENCY_DEPARTMENT_HOSPITAL): Payer: Commercial Managed Care - PPO | Admitting: Anesthesiology

## 2021-06-09 ENCOUNTER — Emergency Department (HOSPITAL_COMMUNITY): Payer: Commercial Managed Care - PPO | Admitting: Anesthesiology

## 2021-06-09 ENCOUNTER — Encounter (HOSPITAL_COMMUNITY): Payer: Self-pay

## 2021-06-09 ENCOUNTER — Telehealth: Payer: Self-pay | Admitting: Family

## 2021-06-09 ENCOUNTER — Encounter (HOSPITAL_COMMUNITY)
Admission: EM | Disposition: A | Discharge status: Home / Self Care | Payer: Self-pay | Source: Home / Self Care | Attending: Emergency Medicine

## 2021-06-09 DIAGNOSIS — Z87891 Personal history of nicotine dependence: Secondary | ICD-10-CM | POA: Diagnosis not present

## 2021-06-09 DIAGNOSIS — K3580 Unspecified acute appendicitis: Secondary | ICD-10-CM | POA: Diagnosis not present

## 2021-06-09 DIAGNOSIS — K3532 Acute appendicitis with perforation and localized peritonitis, without abscess: Secondary | ICD-10-CM | POA: Diagnosis not present

## 2021-06-09 DIAGNOSIS — R1031 Right lower quadrant pain: Secondary | ICD-10-CM | POA: Diagnosis present

## 2021-06-09 HISTORY — PX: LAPAROSCOPIC APPENDECTOMY: SHX408

## 2021-06-09 LAB — URINALYSIS, ROUTINE W REFLEX MICROSCOPIC
Bacteria, UA: NONE SEEN
Bilirubin Urine: NEGATIVE
Glucose, UA: NEGATIVE mg/dL
Hgb urine dipstick: NEGATIVE
Ketones, ur: NEGATIVE mg/dL
Leukocytes,Ua: NEGATIVE
Nitrite: NEGATIVE
Specific Gravity, Urine: <=1.005 (ref 1.005–1.030)
pH: 8 (ref 5.0–8.0)

## 2021-06-09 LAB — COMPREHENSIVE METABOLIC PANEL WITH GFR
ALT: 14 U/L (ref 0–44)
AST: 14 U/L — ABNORMAL LOW (ref 15–41)
Albumin: 4.2 g/dL (ref 3.5–5.0)
Alkaline Phosphatase: 80 U/L (ref 38–126)
Anion gap: 8 (ref 5–15)
BUN: 10 mg/dL (ref 6–20)
CO2: 28 mmol/L (ref 22–32)
Calcium: 9 mg/dL (ref 8.9–10.3)
Chloride: 100 mmol/L (ref 98–111)
Creatinine, Ser: 0.8 mg/dL (ref 0.44–1.00)
GFR, Estimated: >60 mL/min (ref >60)
Glucose, Bld: 120 mg/dL — ABNORMAL HIGH (ref 70–99)
Potassium: 4.2 mmol/L (ref 3.5–5.1)
Sodium: 136 mmol/L (ref 135–145)
Total Bilirubin: 1 mg/dL (ref 0.3–1.2)
Total Protein: 8.1 g/dL (ref 6.5–8.1)

## 2021-06-09 LAB — CBC
HCT: 44.1 % (ref 36.0–46.0)
Hemoglobin: 14.4 g/dL (ref 12.0–15.0)
MCH: 30 pg (ref 26.0–34.0)
MCHC: 32.7 g/dL (ref 30.0–36.0)
MCV: 91.9 fL (ref 80.0–100.0)
Platelets: 323 10*3/uL (ref 150–400)
RBC: 4.8 MIL/uL (ref 3.87–5.11)
RDW: 12.6 % (ref 11.5–15.5)
WBC: 16.3 10*3/uL — ABNORMAL HIGH (ref 4.0–10.5)
nRBC: 0 % (ref 0.0–0.2)

## 2021-06-09 LAB — LIPASE, BLOOD: Lipase: 26 U/L (ref 11–51)

## 2021-06-09 SURGERY — APPENDECTOMY, LAPAROSCOPIC
Anesthesia: General

## 2021-06-09 MED ORDER — ROCURONIUM BROMIDE 10 MG/ML (PF) SYRINGE
PREFILLED_SYRINGE | INTRAVENOUS | Status: DC | PRN
  Administered 2021-06-09: 10 mg via INTRAVENOUS
  Administered 2021-06-09: 30 mg via INTRAVENOUS

## 2021-06-09 MED ORDER — DIPHENHYDRAMINE HCL 50 MG/ML IJ SOLN: 12.5000 mg | Freq: Four times a day (QID) | INTRAMUSCULAR | Status: DC | PRN

## 2021-06-09 MED ORDER — METHOCARBAMOL 1000 MG/10ML IJ SOLN: 1000.0000 mg | Freq: Four times a day (QID) | INTRAVENOUS | Status: DC | PRN

## 2021-06-09 MED ORDER — SIMETHICONE 80 MG PO CHEW: 40.0000 mg | CHEWABLE_TABLET | Freq: Four times a day (QID) | ORAL | Status: DC | PRN

## 2021-06-09 MED ORDER — OXYCODONE HCL 5 MG PO TABS
5.0000 mg | ORAL_TABLET | ORAL | Status: DC | PRN
  Administered 2021-06-09 – 2021-06-11 (×5): 10 mg via ORAL
  Filled 2021-06-09 (×5): qty 2

## 2021-06-09 MED ORDER — GLYCOPYRROLATE 0.2 MG/ML IJ SOLN
INTRAMUSCULAR | Status: DC | PRN
  Administered 2021-06-09: .1 mg via INTRAVENOUS

## 2021-06-09 MED ORDER — PROCHLORPERAZINE MALEATE 10 MG PO TABS: 10.0000 mg | ORAL_TABLET | Freq: Four times a day (QID) | ORAL | Status: DC | PRN

## 2021-06-09 MED ORDER — ADULT MULTIVITAMIN W/MINERALS CH
1.0000 | ORAL_TABLET | Freq: Every day | ORAL | Status: DC
  Administered 2021-06-09 – 2021-06-11 (×3): 1 via ORAL
  Filled 2021-06-09 (×3): qty 1

## 2021-06-09 MED ORDER — PROPOFOL 10 MG/ML IV BOLUS
INTRAVENOUS | Status: DC | PRN
  Administered 2021-06-09: 150 mg via INTRAVENOUS

## 2021-06-09 MED ORDER — DEXAMETHASONE SODIUM PHOSPHATE 10 MG/ML IJ SOLN
INTRAMUSCULAR | Status: DC | PRN
  Administered 2021-06-09: 4 mg via INTRAVENOUS

## 2021-06-09 MED ORDER — SUGAMMADEX SODIUM 200 MG/2ML IV SOLN
INTRAVENOUS | Status: DC | PRN
  Administered 2021-06-09: 200 mg via INTRAVENOUS

## 2021-06-09 MED ORDER — TRAZODONE HCL 50 MG PO TABS: 50.0000 mg | ORAL_TABLET | Freq: Every evening | ORAL | Status: DC | PRN

## 2021-06-09 MED ORDER — PROPOFOL 10 MG/ML IV BOLUS
INTRAVENOUS | Status: AC
  Filled 2021-06-09: qty 20

## 2021-06-09 MED ORDER — MAGNESIUM HYDROXIDE 400 MG/5ML PO SUSP
30.0000 mL | Freq: Every day | ORAL | Status: DC | PRN
  Administered 2021-06-10: 30 mL via ORAL
  Filled 2021-06-09 (×2): qty 30

## 2021-06-09 MED ORDER — MIDAZOLAM HCL 2 MG/2ML IJ SOLN
INTRAMUSCULAR | Status: DC | PRN
  Administered 2021-06-09: 2 mg via INTRAVENOUS

## 2021-06-09 MED ORDER — CHLORHEXIDINE GLUCONATE 0.12 % MT SOLN
15.0000 mL | Freq: Once | OROMUCOSAL | Status: AC
  Administered 2021-06-09: 15 mL via OROMUCOSAL

## 2021-06-09 MED ORDER — SODIUM CHLORIDE (PF) 0.9 % IJ SOLN
INTRAMUSCULAR | Status: AC
  Filled 2021-06-09: qty 50

## 2021-06-09 MED ORDER — MORPHINE SULFATE (PF) 4 MG/ML IV SOLN
4.0000 mg | Freq: Once | INTRAVENOUS | Status: AC
  Administered 2021-06-09: 4 mg via INTRAVENOUS
  Filled 2021-06-09: qty 1

## 2021-06-09 MED ORDER — TRAMADOL HCL 50 MG PO TABS: 50.0000 mg | ORAL_TABLET | Freq: Four times a day (QID) | ORAL | 0 refills | Status: DC | PRN

## 2021-06-09 MED ORDER — METHOCARBAMOL 500 MG PO TABS: 500.0000 mg | ORAL_TABLET | Freq: Four times a day (QID) | ORAL | Status: DC | PRN

## 2021-06-09 MED ORDER — SODIUM CHLORIDE 0.9% FLUSH
3.0000 mL | Freq: Two times a day (BID) | INTRAVENOUS | Status: DC
  Administered 2021-06-09 – 2021-06-11 (×4): 3 mL via INTRAVENOUS

## 2021-06-09 MED ORDER — OXYCODONE HCL 5 MG/5ML PO SOLN: 5.0000 mg | Freq: Once | ORAL | Status: DC | PRN

## 2021-06-09 MED ORDER — LIDOCAINE 2% (20 MG/ML) 5 ML SYRINGE
INTRAMUSCULAR | Status: DC | PRN
  Administered 2021-06-09: 100 mg via INTRAVENOUS

## 2021-06-09 MED ORDER — GABAPENTIN 300 MG PO CAPS
300.0000 mg | ORAL_CAPSULE | ORAL | Status: AC
  Administered 2021-06-09: 300 mg via ORAL
  Filled 2021-06-09: qty 1

## 2021-06-09 MED ORDER — BUPIVACAINE LIPOSOME 1.3 % IJ SUSP
INTRAMUSCULAR | Status: DC | PRN
  Administered 2021-06-09: 20 mL

## 2021-06-09 MED ORDER — FENTANYL CITRATE (PF) 100 MCG/2ML IJ SOLN
INTRAMUSCULAR | Status: AC
  Filled 2021-06-09: qty 2

## 2021-06-09 MED ORDER — PROCHLORPERAZINE EDISYLATE 10 MG/2ML IJ SOLN: 5.0000 mg | Freq: Four times a day (QID) | INTRAMUSCULAR | Status: DC | PRN

## 2021-06-09 MED ORDER — SCOPOLAMINE 1 MG/3DAYS TD PT72
1.0000 | MEDICATED_PATCH | TRANSDERMAL | Status: DC
  Administered 2021-06-09: 1.5 mg via TRANSDERMAL
  Filled 2021-06-09: qty 1

## 2021-06-09 MED ORDER — VITAMIN B-12 1000 MCG PO TABS
1000.0000 ug | ORAL_TABLET | Freq: Every day | ORAL | Status: DC
  Administered 2021-06-09 – 2021-06-11 (×3): 1000 ug via ORAL
  Filled 2021-06-09 (×3): qty 1

## 2021-06-09 MED ORDER — LIP MEDEX EX OINT
1.0000 "application " | TOPICAL_OINTMENT | Freq: Two times a day (BID) | CUTANEOUS | Status: DC
  Administered 2021-06-09 – 2021-06-11 (×4): 1 via TOPICAL
  Filled 2021-06-09: qty 7

## 2021-06-09 MED ORDER — VITAMIN D 25 MCG (1000 UNIT) PO TABS
1000.0000 [IU] | ORAL_TABLET | Freq: Every day | ORAL | Status: DC
  Administered 2021-06-09 – 2021-06-11 (×3): 1000 [IU] via ORAL
  Filled 2021-06-09 (×3): qty 1

## 2021-06-09 MED ORDER — SODIUM CHLORIDE 0.9 % IV SOLN: 250.0000 mL | INTRAVENOUS | Status: DC | PRN

## 2021-06-09 MED ORDER — MORPHINE SULFATE (PF) 4 MG/ML IV SOLN
4.0000 mg | INTRAVENOUS | Status: DC | PRN
Start: 2021-06-09 — End: 2021-06-09

## 2021-06-09 MED ORDER — ONDANSETRON HCL 4 MG/2ML IJ SOLN
INTRAMUSCULAR | Status: DC | PRN
  Administered 2021-06-09: 4 mg via INTRAVENOUS

## 2021-06-09 MED ORDER — DIPHENHYDRAMINE HCL 12.5 MG/5ML PO ELIX: 12.5000 mg | ORAL_SOLUTION | Freq: Four times a day (QID) | ORAL | Status: DC | PRN

## 2021-06-09 MED ORDER — LACTATED RINGERS IR SOLN
Status: DC | PRN
  Administered 2021-06-09: 2

## 2021-06-09 MED ORDER — PHENYLEPHRINE 80 MCG/ML (10ML) SYRINGE FOR IV PUSH (FOR BLOOD PRESSURE SUPPORT)
PREFILLED_SYRINGE | INTRAVENOUS | Status: AC
Start: 2021-06-09 — End: ?
  Filled 2021-06-09: qty 10

## 2021-06-09 MED ORDER — FENTANYL CITRATE PF 50 MCG/ML IJ SOSY
PREFILLED_SYRINGE | INTRAMUSCULAR | Status: AC
  Filled 2021-06-09: qty 1

## 2021-06-09 MED ORDER — ACETAMINOPHEN 500 MG PO TABS
1000.0000 mg | ORAL_TABLET | ORAL | Status: AC
  Administered 2021-06-09: 1000 mg via ORAL
  Filled 2021-06-09: qty 2

## 2021-06-09 MED ORDER — HYDROMORPHONE HCL 1 MG/ML IJ SOLN: 0.5000 mg | INTRAMUSCULAR | Status: DC | PRN

## 2021-06-09 MED ORDER — THIAMINE HCL 100 MG PO TABS
100.0000 mg | ORAL_TABLET | Freq: Every day | ORAL | Status: DC
  Administered 2021-06-09 – 2021-06-11 (×3): 100 mg via ORAL
  Filled 2021-06-09 (×3): qty 1

## 2021-06-09 MED ORDER — 0.9 % SODIUM CHLORIDE (POUR BTL) OPTIME
TOPICAL | Status: DC | PRN
  Administered 2021-06-09: 1000 mL

## 2021-06-09 MED ORDER — ONDANSETRON HCL 4 MG/2ML IJ SOLN
4.0000 mg | Freq: Once | INTRAMUSCULAR | Status: DC | PRN
Start: 2021-06-09 — End: 2021-06-09

## 2021-06-09 MED ORDER — ENALAPRILAT 1.25 MG/ML IV SOLN: 0.6250 mg | Freq: Four times a day (QID) | INTRAVENOUS | Status: DC | PRN

## 2021-06-09 MED ORDER — MAGIC MOUTHWASH: 15.0000 mL | Freq: Four times a day (QID) | ORAL | Status: DC | PRN

## 2021-06-09 MED ORDER — DEXAMETHASONE SODIUM PHOSPHATE 10 MG/ML IJ SOLN
INTRAMUSCULAR | Status: AC
  Filled 2021-06-09: qty 1

## 2021-06-09 MED ORDER — METRONIDAZOLE 500 MG/100ML IV SOLN
500.0000 mg | Freq: Once | INTRAVENOUS | Status: AC
  Administered 2021-06-09: 500 mg via INTRAVENOUS
  Filled 2021-06-09: qty 100

## 2021-06-09 MED ORDER — SODIUM CHLORIDE 0.9 % IV SOLN
2.0000 g | Freq: Once | INTRAVENOUS | Status: AC
  Administered 2021-06-09: 2 g via INTRAVENOUS
  Filled 2021-06-09: qty 20

## 2021-06-09 MED ORDER — MAGNESIUM OXIDE -MG SUPPLEMENT 400 (240 MG) MG PO TABS
200.0000 mg | ORAL_TABLET | Freq: Every day | ORAL | Status: DC
  Administered 2021-06-10 – 2021-06-11 (×2): 200 mg via ORAL
  Filled 2021-06-09 (×2): qty 1

## 2021-06-09 MED ORDER — METOPROLOL TARTRATE 5 MG/5ML IV SOLN: 5.0000 mg | Freq: Four times a day (QID) | INTRAVENOUS | Status: DC | PRN

## 2021-06-09 MED ORDER — ONDANSETRON 4 MG PO TBDP: 4.0000 mg | ORAL_TABLET | Freq: Four times a day (QID) | ORAL | Status: DC | PRN

## 2021-06-09 MED ORDER — GABAPENTIN 300 MG PO CAPS
300.0000 mg | ORAL_CAPSULE | Freq: Two times a day (BID) | ORAL | Status: DC
  Administered 2021-06-09 – 2021-06-11 (×4): 300 mg via ORAL
  Filled 2021-06-09 (×4): qty 1

## 2021-06-09 MED ORDER — ONDANSETRON HCL 4 MG/2ML IJ SOLN
INTRAMUSCULAR | Status: AC
  Filled 2021-06-09: qty 2

## 2021-06-09 MED ORDER — ORAL CARE MOUTH RINSE
15.0000 mL | Freq: Once | OROMUCOSAL | Status: AC
Start: 2021-06-09 — End: 2021-06-09

## 2021-06-09 MED ORDER — SUCCINYLCHOLINE CHLORIDE 200 MG/10ML IV SOSY
PREFILLED_SYRINGE | INTRAVENOUS | Status: DC | PRN
  Administered 2021-06-09: 120 mg via INTRAVENOUS

## 2021-06-09 MED ORDER — FENTANYL CITRATE (PF) 250 MCG/5ML IJ SOLN
INTRAMUSCULAR | Status: DC | PRN
  Administered 2021-06-09 (×4): 50 ug via INTRAVENOUS
  Administered 2021-06-09: 100 ug via INTRAVENOUS

## 2021-06-09 MED ORDER — KETOROLAC TROMETHAMINE 30 MG/ML IJ SOLN
30.0000 mg | Freq: Once | INTRAMUSCULAR | Status: DC | PRN
Start: 2021-06-09 — End: 2021-06-09

## 2021-06-09 MED ORDER — IOHEXOL 300 MG/ML  SOLN
100.0000 mL | Freq: Once | INTRAMUSCULAR | Status: AC | PRN
  Administered 2021-06-09: 100 mL via INTRAVENOUS

## 2021-06-09 MED ORDER — LORATADINE 10 MG PO TABS
10.0000 mg | ORAL_TABLET | Freq: Every day | ORAL | Status: DC
  Administered 2021-06-09 – 2021-06-11 (×3): 10 mg via ORAL
  Filled 2021-06-09 (×3): qty 1

## 2021-06-09 MED ORDER — MIDAZOLAM HCL 2 MG/2ML IJ SOLN
INTRAMUSCULAR | Status: AC
Start: 2021-06-09 — End: ?
  Filled 2021-06-09: qty 2

## 2021-06-09 MED ORDER — ACETAMINOPHEN 500 MG PO TABS
1000.0000 mg | ORAL_TABLET | Freq: Four times a day (QID) | ORAL | Status: DC
  Administered 2021-06-09 – 2021-06-11 (×6): 1000 mg via ORAL
  Filled 2021-06-09 (×7): qty 2

## 2021-06-09 MED ORDER — BUPIVACAINE-EPINEPHRINE 0.25% -1:200000 IJ SOLN
INTRAMUSCULAR | Status: DC | PRN
  Administered 2021-06-09: 30 mL

## 2021-06-09 MED ORDER — SODIUM CHLORIDE 0.9% FLUSH: 3.0000 mL | INTRAVENOUS | Status: DC | PRN

## 2021-06-09 MED ORDER — BUPIVACAINE LIPOSOME 1.3 % IJ SUSP
INTRAMUSCULAR | Status: AC
  Filled 2021-06-09: qty 20

## 2021-06-09 MED ORDER — ONDANSETRON HCL 4 MG/2ML IJ SOLN
4.0000 mg | Freq: Once | INTRAMUSCULAR | Status: AC
  Administered 2021-06-09: 4 mg via INTRAVENOUS
  Filled 2021-06-09: qty 2

## 2021-06-09 MED ORDER — CALCIUM POLYCARBOPHIL 625 MG PO TABS
625.0000 mg | ORAL_TABLET | Freq: Two times a day (BID) | ORAL | Status: DC
  Administered 2021-06-09 – 2021-06-11 (×4): 625 mg via ORAL
  Filled 2021-06-09 (×4): qty 1

## 2021-06-09 MED ORDER — ONDANSETRON HCL 4 MG/2ML IJ SOLN: 4.0000 mg | Freq: Four times a day (QID) | INTRAMUSCULAR | Status: DC | PRN

## 2021-06-09 MED ORDER — FENTANYL CITRATE PF 50 MCG/ML IJ SOSY
25.0000 ug | PREFILLED_SYRINGE | INTRAMUSCULAR | Status: DC | PRN
  Administered 2021-06-09: 50 ug via INTRAVENOUS

## 2021-06-09 MED ORDER — LACTATED RINGERS IV SOLN: INTRAVENOUS | Status: DC

## 2021-06-09 MED ORDER — MEPERIDINE HCL 50 MG/ML IJ SOLN
INTRAMUSCULAR | Status: AC
  Filled 2021-06-09: qty 1

## 2021-06-09 MED ORDER — BISACODYL 10 MG RE SUPP
10.0000 mg | Freq: Every day | RECTAL | Status: DC | PRN
  Administered 2021-06-10: 10 mg via RECTAL
  Filled 2021-06-09 (×2): qty 1

## 2021-06-09 MED ORDER — TRAMADOL HCL 50 MG PO TABS
50.0000 mg | ORAL_TABLET | Freq: Four times a day (QID) | ORAL | Status: DC | PRN
  Administered 2021-06-11: 100 mg via ORAL
  Filled 2021-06-09: qty 2

## 2021-06-09 MED ORDER — MEPERIDINE HCL 50 MG/ML IJ SOLN
6.2500 mg | INTRAMUSCULAR | Status: DC | PRN
  Administered 2021-06-09: 12.5 mg via INTRAVENOUS

## 2021-06-09 MED ORDER — PHENYLEPHRINE 80 MCG/ML (10ML) SYRINGE FOR IV PUSH (FOR BLOOD PRESSURE SUPPORT)
PREFILLED_SYRINGE | INTRAVENOUS | Status: DC | PRN
  Administered 2021-06-09: 80 ug via INTRAVENOUS

## 2021-06-09 MED ORDER — ENOXAPARIN SODIUM 40 MG/0.4ML IJ SOSY
40.0000 mg | PREFILLED_SYRINGE | INTRAMUSCULAR | Status: DC
  Administered 2021-06-10 – 2021-06-11 (×2): 40 mg via SUBCUTANEOUS
  Filled 2021-06-09 (×2): qty 0.4

## 2021-06-09 MED ORDER — OXYCODONE HCL 5 MG PO TABS: 5.0000 mg | ORAL_TABLET | Freq: Once | ORAL | Status: DC | PRN

## 2021-06-09 MED ORDER — LACTATED RINGERS IV BOLUS: 1000.0000 mL | Freq: Three times a day (TID) | INTRAVENOUS | Status: DC | PRN

## 2021-06-09 MED ORDER — ONDANSETRON HCL 4 MG/2ML IJ SOLN
4.0000 mg | Freq: Four times a day (QID) | INTRAMUSCULAR | Status: DC | PRN
Start: 2021-06-09 — End: 2021-06-09

## 2021-06-09 MED ORDER — PIPERACILLIN-TAZOBACTAM 3.375 G IVPB
3.3750 g | Freq: Three times a day (TID) | INTRAVENOUS | Status: DC
  Administered 2021-06-09 – 2021-06-11 (×5): 3.375 g via INTRAVENOUS
  Filled 2021-06-09 (×5): qty 50

## 2021-06-09 MED ORDER — BUPIVACAINE-EPINEPHRINE (PF) 0.25% -1:200000 IJ SOLN
INTRAMUSCULAR | Status: AC
  Filled 2021-06-09: qty 30

## 2021-06-09 SURGICAL SUPPLY — 57 items
APL PRP STRL LF DISP 70% ISPRP (MISCELLANEOUS) ×1
APPLIER CLIP 5 13 M/L LIGAMAX5 (MISCELLANEOUS)
APPLIER CLIP ROT 10 11.4 M/L (STAPLE)
APR CLP MED LRG 11.4X10 (STAPLE)
APR CLP MED LRG 5 ANG JAW (MISCELLANEOUS)
BAG COUNTER SPONGE SURGICOUNT (BAG) IMPLANT
BAG SPEC RTRVL 10 TROC 200 (ENDOMECHANICALS) ×1
BAG SPNG CNTER NS LX DISP (BAG)
CABLE HIGH FREQUENCY MONO STRZ (ELECTRODE) ×3 IMPLANT
CHLORAPREP W/TINT 26 (MISCELLANEOUS) ×3 IMPLANT
CLIP APPLIE 5 13 M/L LIGAMAX5 (MISCELLANEOUS) IMPLANT
CLIP APPLIE ROT 10 11.4 M/L (STAPLE) IMPLANT
COVER SURGICAL LIGHT HANDLE (MISCELLANEOUS) ×3 IMPLANT
DEVICE TROCAR PUNCTURE CLOSURE (ENDOMECHANICALS) IMPLANT
DRAPE LAPAROSCOPIC ABDOMINAL (DRAPES) ×3 IMPLANT
DRAPE WARM FLUID 44X44 (DRAPES) ×2 IMPLANT
DRSG TEGADERM 2-3/8X2-3/4 SM (GAUZE/BANDAGES/DRESSINGS) ×5 IMPLANT
DRSG TEGADERM 4X4.75 (GAUZE/BANDAGES/DRESSINGS) ×3 IMPLANT
ELECT REM PT RETURN 15FT ADLT (MISCELLANEOUS) ×3 IMPLANT
ENDOLOOP SUT PDS II  0 18 (SUTURE)
ENDOLOOP SUT PDS II 0 18 (SUTURE) IMPLANT
GAUZE SPONGE 2X2 8PLY STRL LF (GAUZE/BANDAGES/DRESSINGS) ×2 IMPLANT
GLOVE ECLIPSE 8.0 STRL XLNG CF (GLOVE) ×2 IMPLANT
GLOVE INDICATOR 8.0 STRL GRN (GLOVE) ×2 IMPLANT
GOWN STRL REUS W/ TWL XL LVL3 (GOWN DISPOSABLE) ×3 IMPLANT
GOWN STRL REUS W/TWL XL LVL3 (GOWN DISPOSABLE) ×6
IRRIG SUCT STRYKERFLOW 2 WTIP (MISCELLANEOUS) ×2
IRRIGATION SUCT STRKRFLW 2 WTP (MISCELLANEOUS) ×1 IMPLANT
KIT BASIN OR (CUSTOM PROCEDURE TRAY) ×2 IMPLANT
KIT TURNOVER KIT A (KITS) IMPLANT
PAD POSITIONING PINK XL (MISCELLANEOUS) ×3 IMPLANT
PENCIL SMOKE EVACUATOR (MISCELLANEOUS) IMPLANT
POUCH RETRIEVAL ECOSAC 10 (ENDOMECHANICALS) ×1 IMPLANT
POUCH RETRIEVAL ECOSAC 10MM (ENDOMECHANICALS) ×2
RELOAD STAPLE 60 3.6 BLU REG (STAPLE) IMPLANT
RELOAD STAPLE 60 4.1 GRN THCK (STAPLE) IMPLANT
RELOAD STAPLER BLUE 60MM (STAPLE) ×1 IMPLANT
RELOAD STAPLER GREEN 60MM (STAPLE) IMPLANT
SCISSORS LAP 5X35 DISP (ENDOMECHANICALS) ×2 IMPLANT
SET TUBE SMOKE EVAC HIGH FLOW (TUBING) ×2 IMPLANT
SHEARS HARMONIC ACE PLUS 36CM (ENDOMECHANICALS) IMPLANT
SLEEVE Z-THREAD 5X100MM (TROCAR) ×3 IMPLANT
SPIKE FLUID TRANSFER (MISCELLANEOUS) ×3 IMPLANT
SPONGE GAUZE 2X2 STER 10/PKG (GAUZE/BANDAGES/DRESSINGS) ×1
STAPLE ECHEON FLEX 60 POW ENDO (STAPLE) ×1 IMPLANT
STAPLER RELOAD BLUE 60MM (STAPLE) ×2
STAPLER RELOAD GREEN 60MM (STAPLE)
SUT MNCRL AB 4-0 PS2 18 (SUTURE) ×2 IMPLANT
SUT PDS AB 0 CT1 36 (SUTURE) IMPLANT
SUT PDS AB 1 CT1 27 (SUTURE) IMPLANT
SUT SILK 2 0 SH (SUTURE) IMPLANT
TOWEL OR 17X26 10 PK STRL BLUE (TOWEL DISPOSABLE) ×2 IMPLANT
TRAY FOLEY MTR SLVR 14FR STAT (SET/KITS/TRAYS/PACK) ×2 IMPLANT
TRAY FOLEY MTR SLVR 16FR STAT (SET/KITS/TRAYS/PACK) IMPLANT
TRAY LAPAROSCOPIC (CUSTOM PROCEDURE TRAY) ×3 IMPLANT
TROCAR KII 12X100 BLADELESS (ENDOMECHANICALS) ×3 IMPLANT
TROCAR Z-THREAD OPTICAL 5X100M (TROCAR) ×3 IMPLANT

## 2021-06-09 NOTE — Transfer of Care (Signed)
Immediate Anesthesia Transfer of Care Note  Patient: Tammy Tran  Procedure(s) Performed: APPENDECTOMY LAPAROSCOPIC  Patient Location: PACU  Anesthesia Type:General  Level of Consciousness: awake, alert  and oriented  Airway & Oxygen Therapy: Patient Spontanous Breathing and Patient connected to face mask oxygen  Post-op Assessment: Report given to RN and Post -op Vital signs reviewed and stable  Post vital signs: Reviewed and stable  Last Vitals:  Vitals Value Taken Time  BP 120/85 06/09/21 1855  Temp    Pulse 94 06/09/21 1856  Resp 14 06/09/21 1856  SpO2 100 % 06/09/21 1856  Vitals shown include unvalidated device data.  Last Pain:  Vitals:   06/09/21 1545  TempSrc: Oral  PainSc: 0-No pain      Patients Stated Pain Goal: 3 (06/09/21 1545)  Complications: No notable events documented.

## 2021-06-09 NOTE — Anesthesia Procedure Notes (Signed)
Procedure Name: Intubation Date/Time: 06/09/2021 5:33 PM Performed by: Eben Burow, CRNA Pre-anesthesia Checklist: Patient identified, Emergency Drugs available, Suction available, Patient being monitored and Timeout performed Patient Re-evaluated:Patient Re-evaluated prior to induction Oxygen Delivery Method: Circle system utilized Preoxygenation: Pre-oxygenation with 100% oxygen Induction Type: IV induction and Rapid sequence Laryngoscope Size: Mac and 3 Grade View: Grade II Tube type: Oral Tube size: 7.0 mm Number of attempts: 1 Airway Equipment and Method: Stylet Placement Confirmation: ETT inserted through vocal cords under direct vision, positive ETCO2 and breath sounds checked- equal and bilateral Secured at: 21 cm Tube secured with: Tape Dental Injury: Teeth and Oropharynx as per pre-operative assessment  Comments: Pt has very small mouth opening making visualization more difficult.

## 2021-06-09 NOTE — Op Note (Signed)
PATIENT:  Tammy Tran  51 y.o. female  Patient Care Team: Debbrah Alar, NP as PCP - General (Internal Medicine) Olga Millers, MD as Consulting Physician (Obstetrics and Gynecology) Brunetta Jeans, PA-C (Family Medicine)  PRE-OPERATIVE DIAGNOSIS:  ACUTE APPENDICITIS  POST-OPERATIVE DIAGNOSIS:  ACUTE PERFORATED APPENDICITIS WITH FECALITH  PROCEDURE:   LAPAROSCOPIC APPENDECTOMY  LAPAROSCOPIC LYSIS OF ADHESIONS TRANSVERSUS ABDOMINIS PLANE (TAP) BLOCK - BILATERAL  SURGEON:  Adin Hector, MD  ANESTHESIA:   local and general  Regional TRANSVERSUS ABDOMINIS PLANE (TAP) nerve block for perioperative & postoperative pain control provided with liposomal bupivacaine (Experel) mixed with 0.25% bupivacaine as a Bilateral TAP block x 72mL each side at the level of the transverse abdominis & preperitoneal spaces along the flank at the anterior axillary line, from subcostal ridge to iliac crest under laparoscopic guidance    EBL:  Total I/O In: 1000 [I.V.:1000] Out: 10 [Blood:10]  Delay start of Pharmacological VTE agent (>24hrs) due to surgical blood loss or risk of bleeding:  no  DRAINS: none   SPECIMEN:  APPENDIX with fecalith  DISPOSITION OF SPECIMEN:  PATHOLOGY  COUNTS:  YES  PLAN OF CARE: Admit to inpatient   PATIENT DISPOSITION:  PACU - hemodynamically stable.   INDICATIONS: Patient with concerning symptoms & work up suspicious for appendicitis.  Surgery was recommended:  The anatomy & physiology of the digestive tract was discussed.  The pathophysiology of appendicitis was discussed.  Natural history risks without surgery was discussed.   I feel the risks of no intervention will lead to serious problems that outweigh the operative risks; therefore, I recommended diagnostic laparoscopy with removal of appendix to remove the pathology.  Laparoscopic & open techniques were discussed.   I noted a good likelihood this will help address the problem.    Risks  such as bleeding, infection, abscess, leak, reoperation, possible ostomy, hernia, heart attack, death, and other risks were discussed.  Goals of post-operative recovery were discussed as well.  We will work to minimize complications.  Questions were answered.  The patient expresses understanding & wishes to proceed with surgery.  OR FINDINGS: Perforated appendix with focal feculent peritonitis and spilled fecaliths.  Densely adherent to right adnexa and uterus.  CASE DATA:  Type of patient?: LDOW CASE (Surgical Hospitalist [facility] Inpatient)  Status of Case? EMERGENT Add On  Infection Present At Time Of Surgery (PATOS)?  FECULENT PERITONITIS  DESCRIPTION:   The patient was identified & brought into the operating room. The patient was positioned supine with arms tucked. SCDs were active during the entire case. The patient underwent general anesthesia without any difficulty.  The abdomen was prepped and draped in a sterile fashion. A Surgical Timeout confirmed our plan.  I made an incision through the inner umbilicus and confirmed peritoneal entry.  I placed a 18mm port under optical entry technique with a towel clamp holding the umbilical stalk for fascial countertraction.  Entrance was easy..  We induced carbon dioxide insufflation.  Camera inspection revealed no injury.  I placed additional ports under direct laparoscopic visualization.  I did some laparoscopic lysed adhesions to free the greater omentum off the anterior abdominal wall .  Upon entering the abdomen (organ space), I encountered a phlegmon involving the appendix .  Freddrick March great omentum off its adhesions down in the pelvis and right lower quadrant.  Noticed a obvious phlegmon.  Found the terminal ileum mesentery and gradually rolled out superiorly to find a small abscess pocket with stool consistent with perforated  appendicitis with fecalith.  No major abscess but still significant phlegmon.  Eventually freed it off the right fallopian tube  ovary and uterus.  With that I could mobilize the ileocecal region out of the pelvis.  I mobilized the terminal ileum to proximal ascending colon in a lateral to medial fashion.  I took care to avoid injuring any retroperitoneal structures.  I freed the appendix off its attachments to the ascending colon and cecal mesentery.  I elevated the appendix. I skeletonized and gradually transected through the the mesoappendix. I was able to free off the base of the appendix which was still viable.  I stapled the appendix off the cecum using a laparoscopic stapler. I took a healthy cuff of viable cecum.   I placed the appendix inside an EcoSac bag and removed out the 48mm stapler port in the left suprapubic region.  I did copious irrigation.  I did careful inspection of the intraperitoneal cavity.  Hemostasis was good in the mesoappendix, colon mesentery, adnexa, and retroperitoneum. Staple line was intact on the cecum with no bleeding. I washed out the pelvis, retrohepatic space and right paracolic gutter. I washed out the left side as well.  Hemostasis is good. There was no perforation or injury. Because the area cleaned up well after irrigation, I did not place a drain.  I closed the 12 mm stapler port site fascia with 0 Vicryl stitch primarily.   I aspirated the carbon dioxide. Ports removed.  I closed skin using 4-0 monocryl stitch.  Sterile dressings applied.  Patient was extubated and sent to the recovery room.   I suspect the patient is going used in the hospital at least overnight and will need antibiotics for 5 days.  Called the patient's husband on his mobile phone and got voicemail.  I left a voicemail noting operative findings and how she will need to the stay overnight.  We will try to reach out to him again in the morning.  Adin Hector, M.D., F.A.C.S. Gastrointestinal and Minimally Invasive Surgery [facility] [address] [place], [place] [address] [phone] Main / Paging      06/09/2021 6:46 PM

## 2021-06-09 NOTE — Telephone Encounter (Signed)
Pt in ED.  

## 2021-06-09 NOTE — Interval H&P Note (Signed)
History and Physical Interval Note:  06/09/2021 5:18 PM  Tammy Tran  has presented today for surgery, with the diagnosis of ACUTE APPENDICITIS.  The various methods of treatment have been discussed with the patient and family. After consideration of risks, benefits and other options for treatment, the patient has consented to  Procedure(s): APPENDECTOMY LAPAROSCOPIC (N/A) as a surgical intervention.  The patient's history has been reviewed, patient examined, no change in status, stable for surgery.  I have reviewed the patient's chart and labs.  Questions were answered to the patient's satisfaction.    .scgup  Ardeth Sportsman

## 2021-06-09 NOTE — Discharge Instructions (Addendum)
SURGERY: POST OP INSTRUCTIONS (Surgery for small bowel obstruction, colon resection, etc)   ######################################################################  EAT Gradually transition to a high fiber diet with a fiber supplement over the next few days after discharge  WALK Walk an hour a day.  Control your pain to do that.    CONTROL PAIN Control pain so that you can walk, sleep, tolerate sneezing/coughing, go up/down stairs.  HAVE A BOWEL MOVEMENT DAILY Keep your bowels regular to avoid problems.  OK to try a laxative to override constipation.  OK to use an antidairrheal to slow down diarrhea.  Call if not better after 2 tries  CALL IF YOU HAVE PROBLEMS/CONCERNS Call if you are still struggling despite following these instructions. Call if you have concerns not answered by these instructions  ######################################################################   DIET Follow a light diet the first few days at home.  Start with a bland diet such as soups, liquids, starchy foods, low fat foods, etc.  If you feel full, bloated, or constipated, stay on a ful liquid or pureed/blenderized diet for a few days until you feel better and no longer constipated. Be sure to drink plenty of fluids every day to avoid getting dehydrated (feeling dizzy, not urinating, etc.). Gradually add a fiber supplement to your diet over the next week.  Gradually get back to a regular solid diet.  Avoid fast food or heavy meals the first week as you are more likely to get nauseated. It is expected for your digestive tract to need a few months to get back to normal.  It is common for your bowel movements and stools to be irregular.  You will have occasional bloating and cramping that should eventually fade away.  Until you are eating solid food normally, off all pain medications, and back to regular activities; your bowels will not be normal. Focus on eating a low-fat, high fiber diet the rest of your life  (See Getting to Good Bowel Health, below).  CARE of your INCISION or WOUND  It is good for closed incisions and even open wounds to be washed every day.  Shower every day.  Short baths are fine.  Wash the incisions and wounds clean with soap & water.    You may leave closed incisions open to air if it is dry.   You may cover the incision with clean gauze & replace it after your daily shower for comfort.  TEGADERM:  You have clear gauze band-aid dressings over your closed incision(s).  Remove the dressings 3 days after surgery.    If you have an open wound with a wound vac, see wound vac care instructions.    ACTIVITIES as tolerated Start light daily activities --- self-care, walking, climbing stairs-- beginning the day after surgery.  Gradually increase activities as tolerated.  Control your pain to be active.  Stop when you are tired.  Ideally, walk several times a day, eventually an hour a day.   Most people are back to most day-to-day activities in a few weeks.  It takes 4-8 weeks to get back to unrestricted, intense activity. If you can walk 30 minutes without difficulty, it is safe to try more intense activity such as jogging, treadmill, bicycling, low-impact aerobics, swimming, etc. Save the most intensive and strenuous activity for last (Usually 4-8 weeks after surgery) such as sit-ups, heavy lifting, contact sports, etc.  Refrain from any intense heavy lifting or straining until you are off narcotics for pain control.  You will have off days, but   things should improve week-by-week. DO NOT PUSH THROUGH PAIN.  Let pain be your guide: If it hurts to do something, don't do it.  Pain is your body warning you to avoid that activity for another week until the pain goes down. You may drive when you are no longer taking narcotic prescription pain medication, you can comfortably wear a seatbelt, and you can safely make sudden turns/stops to protect yourself without hesitating due to pain. You may  have sexual intercourse when it is comfortable. If it hurts to do something, stop.  MEDICATIONS Take your usually prescribed home medications unless otherwise directed.   Blood thinners:  Usually you can restart any strong blood thinners after the second postoperative day.  It is OK to take aspirin right away.     If you are on strong blood thinners (warfarin/Coumadin, Plavix, Xerelto, Eliquis, Pradaxa, etc), discuss with your surgeon, medicine PCP, and/or cardiologist for instructions on when to restart the blood thinner & if blood monitoring is needed (PT/INR blood check, etc).     PAIN CONTROL Pain after surgery or related to activity is often due to strain/injury to muscle, tendon, nerves and/or incisions.  This pain is usually short-term and will improve in a few months.  To help speed the process of healing and to get back to regular activity more quickly, DO THE FOLLOWING THINGS TOGETHER: Increase activity gradually.  DO NOT PUSH THROUGH PAIN Use Ice and/or Heat Try Gentle Massage and/or Stretching Take over the counter pain medication Take Narcotic prescription pain medication for more severe pain  Good pain control = faster recovery.  It is better to take more medicine to be more active than to stay in bed all day to avoid medications.  Increase activity gradually Avoid heavy lifting at first, then increase to lifting as tolerated over the next 6 weeks. Do not "push through" the pain.  Listen to your body and avoid positions and maneuvers than reproduce the pain.  Wait a few days before trying something more intense Walking an hour a day is encouraged to help your body recover faster and more safely.  Start slowly and stop when getting sore.  If you can walk 30 minutes without stopping or pain, you can try more intense activity (running, jogging, aerobics, cycling, swimming, treadmill, sex, sports, weightlifting, etc.) Remember: If it hurts to do it, then don't do it! Use Ice and/or  Heat You will have swelling and bruising around the incisions.  This will take several weeks to resolve. Ice packs or heating pads (6-8 times a day, 30-60 minutes at a time) will help sooth soreness & bruising. Some people prefer to use ice alone, heat alone, or alternate between ice & heat.  Experiment and see what works best for you.  Consider trying ice for the first few days to help decrease swelling and bruising; then, switch to heat to help relax sore spots and speed recovery. Shower every day.  Short baths are fine.  It feels good!  Keep the incisions and wounds clean with soap & water.   Try Gentle Massage and/or Stretching Massage at the area of pain many times a day Stop if you feel pain - do not overdo it Take over the counter pain medication This helps the muscle and nerve tissues become less irritable and calm down faster Choose ONE of the following over-the-counter anti-inflammatory medications: Acetaminophen 500mg tabs (Tylenol) 1-2 pills with every meal and just before bedtime (avoid if you have liver problems or   if you have acetaminophen in you narcotic prescription) Naproxen 220mg tabs (ex. Aleve, Naprosyn) 1-2 pills twice a day (avoid if you have kidney, stomach, IBD, or bleeding problems) Ibuprofen 200mg tabs (ex. Advil, Motrin) 3-4 pills with every meal and just before bedtime (avoid if you have kidney, stomach, IBD, or bleeding problems) Take with food/snack several times a day as directed for at least 2 weeks to help keep pain / soreness down & more manageable. Take Narcotic prescription pain medication for more severe pain A prescription for strong pain control is often given to you upon discharge (for example: oxycodone/Percocet, hydrocodone/Norco/Vicodin, or tramadol/Ultram) Take your pain medication as prescribed. Be mindful that most narcotic prescriptions contain Tylenol (acetaminophen) as well - avoid taking too much Tylenol. If you are having problems/concerns with  the prescription medicine (does not control pain, nausea, vomiting, rash, itching, etc.), please call us [phone] to see if we need to switch you to a different pain medicine that will work better for you and/or control your side effects better. If you need a refill on your pain medication, you must call the office before 4 pm and on weekdays only.  By federal law, prescriptions for narcotics cannot be called into a pharmacy.  They must be filled out on paper & picked up from our office by the patient or authorized caretaker.  Prescriptions cannot be filled after 4 pm nor on weekends.    WHEN TO CALL US [phone] Severe uncontrolled or worsening pain  Fever over 101 F (38.5 C) Concerns with the incision: Worsening pain, redness, rash/hives, swelling, bleeding, or drainage Reactions / problems with new medications (itching, rash, hives, nausea, etc.) Nausea and/or vomiting Difficulty urinating Difficulty breathing Worsening fatigue, dizziness, lightheadedness, blurred vision Other concerns If you are not getting better after two weeks or are noticing you are getting worse, contact our office [phone] for further advice.  We may need to adjust your medications, re-evaluate you in the office, send you to the emergency room, or see what other things we can do to help. The clinic staff is available to answer your questions during regular business hours (8:30am-5pm).  Please don't hesitate to call and ask to speak to one of our nurses for clinical concerns.    A surgeon from [facility] is always on call at the hospitals 24 hours/day If you have a medical emergency, go to the nearest emergency room or call 911.  FOLLOW UP in our office One the day of your discharge from the hospital (or the next business weekday), please call [facility] to set up or confirm an appointment to see your surgeon in the office for a follow-up appointment.  Usually it is 2-3 weeks  after your surgery.   If you have skin staples at your incision(s), let the office know so we can set up a time in the office for the nurse to remove them (usually around 10 days after surgery). Make sure that you call for appointments the day of discharge (or the next business weekday) from the hospital to ensure a convenient appointment time. IF YOU HAVE DISABILITY OR FAMILY LEAVE FORMS, BRING THEM TO THE OFFICE FOR PROCESSING.  DO NOT GIVE THEM TO YOUR DOCTOR.  [facility] [address], [address], [place], [place]  [address] ? [phone] - Main [phone] - Toll Free,  [phone] - Fax [email or web address]    GETTING TO GOOD BOWEL HEALTH. It is expected for your   digestive tract to need a few months to get back to normal.  It is common for your bowel movements and stools to be irregular.  You will have occasional bloating and cramping that should eventually fade away.  Until you are eating solid food normally, off all pain medications, and back to regular activities; your bowels will not be normal.   Avoiding constipation The goal: ONE SOFT BOWEL MOVEMENT A DAY!    Drink plenty of fluids.  Choose water first. TAKE A FIBER SUPPLEMENT EVERY DAY THE REST OF YOUR LIFE During your first week back home, gradually add back a fiber supplement every day Experiment which form you can tolerate.   There are many forms such as powders, tablets, wafers, gummies, etc Psyllium bran (Metamucil), methylcellulose (Citrucel), Miralax or Glycolax, Benefiber, Flax Seed.  Adjust the dose week-by-week (1/2 dose/day to 6 doses a day) until you are moving your bowels 1-2 times a day.  Cut back the dose or try a different fiber product if it is giving you problems such as diarrhea or bloating. Sometimes a laxative is needed to help jump-start bowels if constipated until the fiber supplement can help regulate your bowels.  If you are tolerating eating & you are farting, it  is okay to try a gentle laxative such as double dose MiraLax, prune juice, or Milk of Magnesia.  Avoid using laxatives too often. Stool softeners can sometimes help counteract the constipating effects of narcotic pain medicines.  It can also cause diarrhea, so avoid using for too long. If you are still constipated despite taking fiber daily, eating solids, and a few doses of laxatives, call our office. Controlling diarrhea Try drinking liquids and eating bland foods for a few days to avoid stressing your intestines further. Avoid dairy products (especially milk & ice cream) for a short time.  The intestines often can lose the ability to digest lactose when stressed. Avoid foods that cause gassiness or bloating.  Typical foods include beans and other legumes, cabbage, broccoli, and dairy foods.  Avoid greasy, spicy, fast foods.  Every person has some sensitivity to other foods, so listen to your body and avoid those foods that trigger problems for you. Probiotics (such as active yogurt, Align, etc) may help repopulate the intestines and colon with normal bacteria and calm down a sensitive digestive tract Adding a fiber supplement gradually can help thicken stools by absorbing excess fluid and retrain the intestines to act more normally.  Slowly increase the dose over a few weeks.  Too much fiber too soon can backfire and cause cramping & bloating. It is okay to try and slow down diarrhea with a few doses of antidiarrheal medicines.   Bismuth subsalicylate (ex. Kayopectate, Pepto Bismol) for a few doses can help control diarrhea.  Avoid if pregnant.   Loperamide (Imodium) can slow down diarrhea.  Start with one tablet (2mg) first.  Avoid if you are having fevers or severe pain.  ILEOSTOMY PATIENTS WILL HAVE CHRONIC DIARRHEA since their colon is not in use.    Drink plenty of liquids.  You will need to drink even more glasses of water/liquid a day to avoid getting dehydrated. Record output from your  ileostomy.  Expect to empty the bag every 3-4 hours at first.  Most people with a permanent ileostomy empty their bag 4-6 times at the least.   Use antidiarrheal medicine (especially Imodium) several times a day to avoid getting dehydrated.  Start with a dose at bedtime & breakfast.    Adjust up or down as needed.  Increase antidiarrheal medications as directed to avoid emptying the bag more than 8 times a day (every 3 hours). Work with your wound ostomy nurse to learn care for your ostomy.  See ostomy care instructions. TROUBLESHOOTING IRREGULAR BOWELS 1) Start with a soft & bland diet. No spicy, greasy, or fried foods.  2) Avoid gluten/wheat or dairy products from diet to see if symptoms improve. 3) Miralax 17gm or flax seed mixed in 8oz. water or juice-daily. May use 2-4 times a day as needed. 4) Gas-X, Phazyme, etc. as needed for gas & bloating.  5) Prilosec (omeprazole) over-the-counter as needed 6)  Consider probiotics (Align, Activa, etc) to help calm the bowels down  Call your doctor if you are getting worse or not getting better.  Sometimes further testing (cultures, endoscopy, X-ray studies, CT scans, bloodwork, etc.) may be needed to help diagnose and treat the cause of the diarrhea. [facility] [address], [address], [place], [place]  [address] [phone] - Main.    [phone]  - Toll Free.   [phone] - Fax [email or web address]  

## 2021-06-09 NOTE — ED Triage Notes (Signed)
Patient states she began having abdominal discomfort 2 days ago, yesterday she had abdominal distention and today worsening pain in the RLQ and fever, and vomiting.

## 2021-06-09 NOTE — H&P (Signed)
H&P Note  Burnis KingfisherLaura H O'Byrne 1970/04/23  [id number].    Requesting MD: Virgina NorfolkAdam Curatolo, DO Chief Complaint/Reason for Consult: Appendicitis   HPI:  Patient is an otherwise healthy 51 year old female who presented to the ED with 2-3 days of abdominal pain. Ate a large meal Saturday and just felt bloated and sort of sore after. She thought she just had gas pains and tried some gas-x without relief. Soreness progressively worsened and she noted some chills and sweats at home and then this AM had nausea and vomiting as well. Denies chest pain, SOB, urinary symptoms. Intolerance to erythromycin. She does not take any blood thinners. Previous abdominal surgery includes cesarean section in 2000. She is accompanied by her husband and reports she is not working at this time.   ROS: Negative other than HPI  Family History  Adopted: Yes    Past Medical History:  Diagnosis Date   Allergy    Year-round. Multiple environmental allergies.   Chickenpox    5th grade    Past Surgical History:  Procedure Laterality Date   CESAREAN SECTION  2000    Social History:  reports that she has quit smoking. Her smoking use included cigarettes. She smoked an average of .25 packs per day. She has never used smokeless tobacco. She reports that she does not currently use alcohol. She reports that she does not use drugs.  Allergies:  Allergies  Allergen Reactions   Erythromycin Nausea Only   Erythromycin Diarrhea and Nausea And Vomiting    (Not in a hospital admission)   Blood pressure 119/68, pulse 97, temperature 99.7 F (37.6 C), temperature source Oral, resp. rate 16, height 5\' 7"  (1.702 m), weight 63.5 kg, SpO2 100 %. Physical Exam:  General: pleasant, WD, WN female who is laying in bed in NAD HEENT: head is normocephalic, atraumatic.  Sclera are noninjected. Ears and nose without any masses or lesions.  Mouth is pink and moist Heart: regular, rate, and rhythm.  Normal s1,s2. No obvious  murmurs, gallops, or rubs noted.  Palpable radial and pedal pulses bilaterally Lungs: CTAB, no wheezes, rhonchi, or rales noted.  Respiratory effort nonlabored Abd: soft, ttp in RLQ without peritonitis, ND, +BS, no masses, hernias, or organomegaly MS: all 4 extremities are symmetrical with no cyanosis, clubbing, or edema. Skin: warm and dry with no masses, lesions, or rashes Neuro: Cranial nerves 2-12 grossly intact, sensation is normal throughout Psych: A&Ox3 with an appropriate affect.   Results for orders placed or performed during the hospital encounter of 06/09/21 (from the past 48 hour(s))  Lipase, blood     Status: None   Collection Time: 06/09/21 12:10 PM  Result Value Ref Range   Lipase 26 11 - 51 U/L    Comment: Performed at [facility], [address] [place], [place] [address]  Comprehensive metabolic panel     Status: Abnormal   Collection Time: 06/09/21 12:10 PM  Result Value Ref Range   Sodium 136 135 - 145 mmol/L   Potassium 4.2 3.5 - 5.1 mmol/L   Chloride 100 98 - 111 mmol/L   CO2 28 22 - 32 mmol/L   Glucose, Bld 120 (H) 70 - 99 mg/dL    Comment: Glucose reference range applies only to samples taken after fasting for at least 8 hours.   BUN 10 6 - 20 mg/dL   Creatinine, Ser 1.910.80 0.44 - 1.00 mg/dL   Calcium 9.0 8.9 - 47.810.3 mg/dL  Total Protein 8.1 6.5 - 8.1 g/dL   Albumin 4.2 3.5 - 5.0 g/dL   AST 14 (L) 15 - 41 U/L   ALT 14 0 - 44 U/L   Alkaline Phosphatase 80 38 - 126 U/L   Total Bilirubin 1.0 0.3 - 1.2 mg/dL   GFR, Estimated >01 >02 mL/min    Comment: (NOTE) Calculated using the CKD-EPI Creatinine Equation (2021)    Anion gap 8 5 - 15    Comment: Performed at [facility], [address], [place], [place] [address]  CBC     Status: Abnormal   Collection Time: 06/09/21 12:10 PM  Result Value Ref Range   WBC 16.3 (H) 4.0 - 10.5 K/uL   RBC 4.80 3.87 - 5.11 MIL/uL   Hemoglobin 14.4 12.0 - 15.0 g/dL   HCT 64.4 03.4 -  74.2 %   MCV 91.9 80.0 - 100.0 fL   MCH 30.0 26.0 - 34.0 pg   MCHC 32.7 30.0 - 36.0 g/dL   RDW 59.5 63.8 - 75.6 %   Platelets 323 150 - 400 K/uL   nRBC 0.0 0.0 - 0.2 %    Comment: Performed at [facility], [address], [place], [place] [address]   CT ABDOMEN PELVIS W CONTRAST  Result Date: 06/09/2021 CLINICAL DATA:  Right lower quadrant abdominal pain starting 2 days ago EXAM: CT ABDOMEN AND PELVIS WITH CONTRAST TECHNIQUE: Multidetector CT imaging of the abdomen and pelvis was performed using the standard protocol following bolus administration of intravenous contrast. RADIATION DOSE REDUCTION: This exam was performed according to the departmental dose-optimization program which includes automated exposure control, adjustment of the mA and/or kV according to patient size and/or use of iterative reconstruction technique. CONTRAST:  OMNIPAQUE IOHEXOL 300 MG/ML  SOLN COMPARISON:  None Available. FINDINGS: Lower chest: Unremarkable Hepatobiliary: Unremarkable Pancreas: Unremarkable Spleen: Unremarkable Adrenals/Urinary Tract: 0.5 cm hypodense lesion of the right kidney lower pole on image 80 of series 4 highly likely to be a cyst but technically nonspecific due to small size. In general, such lesions are not felt to warrant further workup unless there is a specific renal clinical concern. Stomach/Bowel: Acute appendicitis is present, appendiceal diameter 1.5 cm on image 68 series 4 with substantial surrounding periappendiceal edema. No extraluminal gas or obvious drainable abscess. The appendix extends posterior from the cecum, superficial to the iliac vessels. No dilated bowel. No free intraperitoneal gas. Trace free fluid in the pelvis. Vascular/Lymphatic: Mild abdominal aortic atherosclerotic vascular disease. Reproductive: Unremarkable Other: No supplemental non-categorized findings. Musculoskeletal: Right foraminal impingement at L5-S1 due to spurring. IMPRESSION: 1. Acute  appendicitis. The appendix measures 1.5 cm in diameter with substantial periappendiceal edema, and there is a small amount of free pelvic fluid. No extraluminal gas or abscess identified. 2. Mild abdominal aortic atherosclerotic vascular calcification. 3. Right foraminal impingement at L5-S1 due to spurring. Electronically Signed   By: Gaylyn Rong M.D.   On: 06/09/2021 14:22      Assessment/Plan Acute appendicitis  - CT with dilated appendix to 1.5 cm and periappendiceal edema, small amount of free pelvic fluid, but no extraluminal gas or abscess - WBC 16, afebrile and hemodynamically stable  - History, exam and imaging consistent with acute appendicitis. Discussed operative vs non-operative intervention.  I have explained the procedure, risks, and aftercare of Laparoscopic Appendectomy.  Risks include but are not limited to anesthesia (MI, CVA, death), bleeding, infection, wound problems, hernia, injury to surrounding structures (viscus, nerves, blood vessels, ureter),  need for conversion to open procedure or ileocecectomy, post operative ileus or abscess, stump leak, stump appendicitis and increased risk of DVT/PE.  She seems to understand and agrees to proceed with surgery. Keep NPO. Start IV abx. Admit to outpatient observation. Possible d/c from PACU.    FEN: NPO VTE: none currently ID: [email or web address]    I reviewed ED provider notes, last 24 h vitals and pain scores, last 24 h labs and trends, and last 24 h imaging results.   Juliet Rude, [facility] Surgery 06/09/2021, 2:41 PM Please see Amion for pager number during day hours 7:00am-4:30pm

## 2021-06-09 NOTE — ED Provider Notes (Signed)
[facility] DEPT Provider Note   CSN: [id number] Arrival date & time: 06/09/21  1031     History  Chief Complaint  Patient presents with   Abdominal Pain   Emesis   Fever    Tammy Tran is a 51 y.o. female.  The history is provided by the patient and medical records. No language interpreter was used.  Abdominal Pain Associated symptoms: fever and vomiting   Emesis Associated symptoms: abdominal pain and fever   Fever Associated symptoms: vomiting    51 year old female presenting with complaints of abdominal pain.  For the past 2 to 3 days patient has had progressive worsening pain localized to her right lower quadrant.  Pain is sharp throbbing worsening with movement and now she endorsed nausea and vomiting.  She does some subjective fever, decrease in appetite.  Endorses increased urinary frequency without burning urination or urine odor.  No chest pain or shortness of breath no vaginal bleeding or vaginal discharge.  She is postmenopausal.  No recent sick contact.  No significant treatment tried at home.  Home Medications Prior to Admission medications   Medication Sig Start Date End Date Taking? Authorizing Provider  cetirizine (ZYRTEC) 10 MG tablet Take 10 mg by mouth daily.    [provider]  Cholecalciferol (VITAMIN D-3) 1000 units CAPS Take 1 capsule (1,000 Units total) by mouth daily. 11/19/15   Brunetta Jeans, PA-C  Cyanocobalamin (B-12) 1000 MCG TABS Take 1 tablet (1,000 mcg total) by mouth daily. 05/08/19   Debbrah Alar, NP  levonorgestrel (MIRENA) 20 MCG/24HR IUD 1 each by Intrauterine route once.    [provider]  Magnesium 300 MG CAPS Take 1 capsule (300 mg total) by mouth daily. 05/08/19   Debbrah Alar, NP  Multiple Vitamins-Minerals (MULTIVITAMIN WITH MINERALS) tablet Take 1 tablet by mouth daily. 12/18/19   Debbrah Alar, NP  Omega-3 Fatty Acids (SUPER OMEGA 3 EPA/DHA) 1000 MG CAPS Take 2  caps by mouth once daily. 05/08/19   Debbrah Alar, NP  thiamine 100 MG tablet Take 1 tablet (100 mg total) by mouth daily. 05/08/19   Debbrah Alar, NP  traZODone (DESYREL) 50 MG tablet TAKE 1 TABLET(50 MG) BY MOUTH AT BEDTIME AS NEEDED FOR SLEEP 05/15/21   Debbrah Alar, NP      Allergies    Erythromycin and Erythromycin    Review of Systems   Review of Systems  Constitutional:  Positive for fever.  Gastrointestinal:  Positive for abdominal pain and vomiting.  All other systems reviewed and are negative.  Physical Exam Updated Vital Signs BP 119/68 (BP Location: Left Arm)   Pulse 97   Temp 99.7 F (37.6 C) (Oral)   Resp 16   Ht 5\' 7"  (1.702 m)   Wt 63.5 kg   SpO2 100%   BMI 21.93 kg/m  Physical Exam Vitals and nursing note reviewed.  Constitutional:      General: She is not in acute distress.    Appearance: She is well-developed.  HENT:     Head: Atraumatic.  Eyes:     Conjunctiva/sclera: Conjunctivae normal.  Cardiovascular:     Rate and Rhythm: Normal rate and regular rhythm.  Pulmonary:     Effort: Pulmonary effort is normal.  Abdominal:     Palpations: Abdomen is soft.     Tenderness: There is abdominal tenderness in the right lower quadrant. There is guarding. There is no rebound. Positive signs include McBurney's sign. Negative signs include Murphy's sign.  Musculoskeletal:     Cervical back: Neck supple.  Skin:    Findings: No rash.  Neurological:     Mental Status: She is alert.  Psychiatric:        Mood and Affect: Mood normal.    ED Results / Procedures / Treatments   Labs (all labs ordered are listed, but only abnormal results are displayed) Labs Reviewed  COMPREHENSIVE METABOLIC PANEL - Abnormal; Notable for the following components:      Result Value   Glucose, Bld 120 (*)    AST 14 (*)    All other components within normal limits  CBC - Abnormal; Notable for the following components:   WBC 16.3 (*)    All other components  within normal limits  LIPASE, BLOOD  URINALYSIS, ROUTINE W REFLEX MICROSCOPIC    EKG None  Radiology CT ABDOMEN PELVIS W CONTRAST  Result Date: 06/09/2021 CLINICAL DATA:  Right lower quadrant abdominal pain starting 2 days ago EXAM: CT ABDOMEN AND PELVIS WITH CONTRAST TECHNIQUE: Multidetector CT imaging of the abdomen and pelvis was performed using the standard protocol following bolus administration of intravenous contrast. RADIATION DOSE REDUCTION: This exam was performed according to the departmental dose-optimization program which includes automated exposure control, adjustment of the mA and/or kV according to patient size and/or use of iterative reconstruction technique. CONTRAST:  173mL OMNIPAQUE IOHEXOL 300 MG/ML  SOLN COMPARISON:  None Available. FINDINGS: Lower chest: Unremarkable Hepatobiliary: Unremarkable Pancreas: Unremarkable Spleen: Unremarkable Adrenals/Urinary Tract: 0.5 cm hypodense lesion of the right kidney lower pole on image 80 of series 4 highly likely to be a cyst but technically nonspecific due to small size. In general, such lesions are not felt to warrant further workup unless there is a specific renal clinical concern. Stomach/Bowel: Acute appendicitis is present, appendiceal diameter 1.5 cm on image 68 series 4 with substantial surrounding periappendiceal edema. No extraluminal gas or obvious drainable abscess. The appendix extends posterior from the cecum, superficial to the iliac vessels. No dilated bowel. No free intraperitoneal gas. Trace free fluid in the pelvis. Vascular/Lymphatic: Mild abdominal aortic atherosclerotic vascular disease. Reproductive: Unremarkable Other: No supplemental non-categorized findings. Musculoskeletal: Right foraminal impingement at L5-S1 due to spurring. IMPRESSION: 1. Acute appendicitis. The appendix measures 1.5 cm in diameter with substantial periappendiceal edema, and there is a small amount of free pelvic fluid. No extraluminal gas or  abscess identified. 2. Mild abdominal aortic atherosclerotic vascular calcification. 3. Right foraminal impingement at L5-S1 due to spurring. Electronically Signed   By: Van Clines M.D.   On: 06/09/2021 14:22    Procedures Procedures    Medications Ordered in ED Medications  cefTRIAXone (ROCEPHIN) 2 g in sodium chloride 0.9 % 100 mL IVPB (2 g Intravenous New Bag/Given 06/09/21 1439)    And  metroNIDAZOLE (FLAGYL) IVPB 500 mg (500 mg Intravenous New Bag/Given 06/09/21 1439)  iohexol (OMNIPAQUE) 300 MG/ML solution 100 mL (100 mLs Intravenous Contrast Given 06/09/21 1401)  sodium chloride (PF) 0.9 % injection (  Given 06/09/21 1442)  morphine (PF) 4 MG/ML injection 4 mg (4 mg Intravenous Given 06/09/21 1439)  ondansetron (ZOFRAN) injection 4 mg (4 mg Intravenous Given 06/09/21 1438)    ED Course/ Medical Decision Making/ A&P                           Medical Decision Making Amount and/or Complexity of Data Reviewed Labs: ordered. Radiology: ordered.  Risk Prescription drug management.   BP 119/68 (BP Location:  Left Arm)   Pulse 97   Temp 99.7 F (37.6 C) (Oral)   Resp 16   Ht 5\' 7"  (1.702 m)   Wt 63.5 kg   SpO2 100%   BMI 21.93 kg/m   2:27 PM This is a 51 year old female here with right lower quadrant abdominal pain concerning for appendicitis.  Symptoms ongoing for the past 2 to 3 days.  She does have focal point tenderness to her right lower quadrant at the McBurney's point on exam.  She does not complain of any other GU complaints.  She does have an oral temperature of 99.7.  Work-up initiated.  2:32 PM Labs and imaging obtained independently viewed interpreted by me and agree with radiologist interpretation.  Labs remarkable for white count of 16.3, and abdominal and pelvis CT scan showing evidence of acute appendicitis with appendix measuring approximate 1.5 cm in diameter with significant surrounding edema.  There is a small amount of free pelvic fluid but no extraluminal  gas or abscess noted.  Patient was made aware of findings.  Will initiate antibiotic, consult general surgery and will have patient admitted for further management of her condition.  Patient remains NPO.  Antinausea medication and opiate medication ordered as well as antibiotic.  This patient presents to the ED for concern of abd pain, this involves an extensive number of treatment options, and is a complaint that carries with it a high risk of complications and morbidity.  The differential diagnosis includes appendicitis, colitis, diverticulitis, cholecystitis, pancreatitis, ovarian torsion, TOA, PID, kidney stone, UTI  Co morbidities that complicate the patient evaluation hx of pancreatitis Alcohol use Additional history obtained:  Additional history obtained from family member External records from outside source obtained and reviewed including notes from PCP  Lab Tests:  I Ordered, and personally interpreted labs.  The pertinent results include:  as above  Imaging Studies ordered:  I ordered imaging studies including abd/pelvis CT I independently visualized and interpreted imaging which showed acute appendicitis I agree with the radiologist interpretation  Cardiac Monitoring:  The patient was maintained on a cardiac monitor.  I personally viewed and interpreted the cardiac monitored which showed an underlying rhythm of: NSR  Medicines ordered and prescription drug management:  I ordered medication including rocephin/metronidazole  for appendicitis Reevaluation of the patient after these medicines showed that the patient improved I have reviewed the patients home medicines and have made adjustments as needed  Test Considered: pelvic exam, however sxs consistent with acute appendicitis  Critical Interventions: IV abx  IV opiate  antiemetic  Consultations Obtained:  I requested consultation with the General Surgeon Dr. Lynford Humphrey,  and discussed lab and imaging findings as  well as pertinent plan - they recommend: admission  Problem List / ED Course: acute appendicits  Reevaluation:  After the interventions noted above, I reevaluated the patient and found that they have :improved  Social Determinants of Health: tobacco use  Dispostion:  After consideration of the diagnostic results and the patients response to treatment, I feel that the patent would benefit from admission.         Final Clinical Impression(s) / ED Diagnoses Final diagnoses:  Acute appendicitis, unspecified acute appendicitis type    Rx / DC Orders ED Discharge Orders     None         Domenic Moras, PA-C 06/09/21 1452    Lennice Sites, DO 06/09/21 1515

## 2021-06-09 NOTE — ED Provider Triage Note (Signed)
Emergency Medicine Provider Triage Evaluation Note  Tammy Tran , a 51 y.o. female  was evaluated in triage.  Pt complains of abd pain. RLQ abd pain with nausea, urinary frequency and decreased appetite x 3-4 days. Post menopausal.  Intact appendix. No fever  Review of Systems  Positive: As above Negative: As above  Physical Exam  BP 124/70 (BP Location: Right Arm)   Pulse 76   Temp 99.7 F (37.6 C) (Oral)   Resp 16   Ht 5\' 7"  (1.702 m)   Wt 63.5 kg   SpO2 98%   BMI 21.93 kg/m  Gen:   Awake, no distress   Resp:  Normal effort  MSK:   Moves extremities without difficulty  Other:  TTP RLQ  Medical Decision Making  Medically screening exam initiated at 10:58 AM.  Appropriate orders placed.  Tammy Tran was informed that the remainder of the evaluation will be completed by another provider, this initial triage assessment does not replace that evaluation, and the importance of remaining in the ED until their evaluation is complete.     Domenic Moras, PA-C 06/09/21 1106

## 2021-06-09 NOTE — Telephone Encounter (Signed)
Nurse Assessment Nurse: D'Heur Ezzard Standing, RN, Adrienne Date/Time (Eastern Time): 06/09/2021 10:10:11 AM Confirm and document reason for call. If symptomatic, describe symptoms. ---Caller states she has a swollen stomach, her abdomen is really hard, her right lower side is causing severe abdominal pain, vomiting x2, cold sweats. Her s/s started with gassiness on Friday night. She was also chilling. Temp is 97.8 forehead. Does the patient have any new or worsening symptoms? ---Yes Will a triage be completed? ---Yes Related visit to physician within the last 2 weeks? ---No Does the PT have any chronic conditions? (i.e. diabetes, asthma, this includes High risk factors for pregnancy, etc.) ---Yes List chronic conditions. ---allergies, Is the patient pregnant or possibly pregnant? (Ask all females between the ages of 17-55) ---No Is this a behavioral health or substance abuse call? ---No Guidelines Guideline Title Affirmed Question Affirmed Notes Nurse Date/Time Lamount Cohen Time) Abdominal Pain - Female [1] SEVERE pain (e.g., excruciating) AND [2] present > 1 hour D'Heur Ezzard Standing, RN, Hansel Starling 06/09/2021 10:14:09 AM PLEASE NOTE: All timestamps contained within this report are represented as [place] Standard Time. CONFIDENTIALTY NOTICE: This fax transmission is intended only for the addressee. It contains information that is legally privileged, confidential or otherwise protected from use or disclosure. If you are not the intended recipient, you are strictly prohibited from reviewing, disclosing, copying using or disseminating any of this information or taking any action in reliance on or regarding this information. If you have received this fax in error, please notify us immediately by telephone so that we can arrange for its return to [place]. Phone: [phone], Toll-Free: [phone], Fax: [phone] Page: 2 of 2 Call Id: [id number] Disp. Time Lamount Cohen Time) Disposition Final User 06/09/2021  10:07:49 AM Send to Urgent Milus Glazier 06/09/2021 10:16:27 AM Go to ED Now Yes D'Heur Ezzard Standing, RN, Maple Hudson Disagree/Comply Comply Caller Understands Yes PreDisposition Call Doctor Care Advice Given Per Guideline GO TO ED NOW: * You need to be seen in the Emergency Department. * Leave now. Drive carefully. ANOTHER ADULT SHOULD DRIVE: * It is better and safer if another adult drives instead of you. NOTHING BY MOUTH: * Do not eat or drink anything for now. CARE ADVICE given per Abdominal Pain - Female (Adult) guideline. Referrals [facility] - ED

## 2021-06-09 NOTE — Anesthesia Preprocedure Evaluation (Signed)
Anesthesia Evaluation  Patient identified by MRN, date of birth, ID band Patient awake    Reviewed: Allergy & Precautions, NPO status , Patient's Chart, lab work & pertinent test results  Airway Mallampati: II  TM Distance: >3 FB Neck ROM: Full    Dental no notable dental hx.    Pulmonary neg pulmonary ROS, former smoker,    Pulmonary exam normal breath sounds clear to auscultation       Cardiovascular Normal cardiovascular exam Rhythm:Regular Rate:Normal     Neuro/Psych negative neurological ROS  negative psych ROS   GI/Hepatic negative GI ROS, Neg liver ROS,   Endo/Other  negative endocrine ROS  Renal/GU negative Renal ROS  negative genitourinary   Musculoskeletal negative musculoskeletal ROS (+)   Abdominal   Peds negative pediatric ROS (+)  Hematology negative hematology ROS (+)   Anesthesia Other Findings   Reproductive/Obstetrics negative OB ROS                             Anesthesia Physical Anesthesia Plan  ASA: 1  Anesthesia Plan: General   Post-op Pain Management: Minimal or no pain anticipated   Induction: Intravenous and Rapid sequence  PONV Risk Score and Plan: 3 and Ondansetron, Dexamethasone, Midazolam and Treatment may vary due to age or medical condition  Airway Management Planned: Oral ETT  Additional Equipment:   Intra-op Plan:   Post-operative Plan: Extubation in OR  Informed Consent: I have reviewed the patients History and Physical, chart, labs and discussed the procedure including the risks, benefits and alternatives for the proposed anesthesia with the patient or authorized representative who has indicated his/her understanding and acceptance.     Dental advisory given  Plan Discussed with: CRNA and Surgeon  Anesthesia Plan Comments:         Anesthesia Quick Evaluation

## 2021-06-09 NOTE — Telephone Encounter (Signed)
Pt called to be triaged for severe abdominal pain. Transferred.

## 2021-06-10 ENCOUNTER — Encounter (HOSPITAL_COMMUNITY): Payer: Self-pay | Admitting: Surgery

## 2021-06-10 NOTE — Progress Notes (Signed)
Progress Note  1 Day Post-Op  Subjective: Pt reports abdominal soreness this AM. Denies nausea or vomiting, tolerating PO intake. No flatus or BM yet but has been ambulating.   Objective: Vital signs in last 24 hours: Temp:  [97.5 F (36.4 C)-99.7 F (37.6 C)] 97.6 F (36.4 C) (06/06 0917) Pulse Rate:  [76-100] 93 (06/06 0917) Resp:  [9-18] 18 (06/06 0917) BP: (102-131)/(61-85) 107/68 (06/06 0917) SpO2:  [93 %-100 %] 97 % (06/06 0917) Weight:  [63.5 kg] 63.5 kg (06/05 1046) Last BM Date : 06/06/21  Intake/Output from previous day: 06/05 0701 - 06/06 0700 In: 1794 [P.O.:240; I.V.:1000; IV Piggyback:54] Out: 610 [Urine:600; Blood:10] Intake/Output this shift: Total I/O In: 120 [P.O.:120] Out: 0   PE: General: pleasant, WD, WN female who is laying in bed in NAD Heart: regular, rate, and rhythm.  Lungs:  Respiratory effort nonlabored Abd: soft, appropriately ttp, mild distention, incisions C/D/I, +BS Psych: A&Ox3 with an appropriate affect.    Lab Results:  Recent Labs    06/09/21 1210  WBC 16.3*  HGB 14.4  HCT 44.1  PLT 323   BMET Recent Labs    06/09/21 1210  NA 136  K 4.2  CL 100  CO2 28  GLUCOSE 120*  BUN 10  CREATININE 0.80  CALCIUM 9.0   PT/INR No results for input(s): LABPROT, INR in the last 72 hours. CMP     Component Value Date/Time   NA 136 06/09/2021 1210   K 4.2 06/09/2021 1210   CL 100 06/09/2021 1210   CO2 28 06/09/2021 1210   GLUCOSE 120 (H) 06/09/2021 1210   BUN 10 06/09/2021 1210   CREATININE 0.80 06/09/2021 1210   CALCIUM 9.0 06/09/2021 1210   PROT 8.1 06/09/2021 1210   ALBUMIN 4.2 06/09/2021 1210   AST 14 (L) 06/09/2021 1210   ALT 14 06/09/2021 1210   ALKPHOS 80 06/09/2021 1210   BILITOT 1.0 06/09/2021 1210   GFRNONAA >60 06/09/2021 1210   GFRAA >60 01/12/2019 0536   Lipase     Component Value Date/Time   LIPASE 26 06/09/2021 1210       Studies/Results: CT ABDOMEN PELVIS W CONTRAST  Result Date:  06/09/2021 CLINICAL DATA:  Right lower quadrant abdominal pain starting 2 days ago EXAM: CT ABDOMEN AND PELVIS WITH CONTRAST TECHNIQUE: Multidetector CT imaging of the abdomen and pelvis was performed using the standard protocol following bolus administration of intravenous contrast. RADIATION DOSE REDUCTION: This exam was performed according to the departmental dose-optimization program which includes automated exposure control, adjustment of the mA and/or kV according to patient size and/or use of iterative reconstruction technique. CONTRAST:  OMNIPAQUE IOHEXOL 300 MG/ML  SOLN COMPARISON:  None Available. FINDINGS: Lower chest: Unremarkable Hepatobiliary: Unremarkable Pancreas: Unremarkable Spleen: Unremarkable Adrenals/Urinary Tract: 0.5 cm hypodense lesion of the right kidney lower pole on image 80 of series 4 highly likely to be a cyst but technically nonspecific due to small size. In general, such lesions are not felt to warrant further workup unless there is a specific renal clinical concern. Stomach/Bowel: Acute appendicitis is present, appendiceal diameter 1.5 cm on image 68 series 4 with substantial surrounding periappendiceal edema. No extraluminal gas or obvious drainable abscess. The appendix extends posterior from the cecum, superficial to the iliac vessels. No dilated bowel. No free intraperitoneal gas. Trace free fluid in the pelvis. Vascular/Lymphatic: Mild abdominal aortic atherosclerotic vascular disease. Reproductive: Unremarkable Other: No supplemental non-categorized findings. Musculoskeletal: Right foraminal impingement at L5-S1 due to spurring. IMPRESSION:  1. Acute appendicitis. The appendix measures 1.5 cm in diameter with substantial periappendiceal edema, and there is a small amount of free pelvic fluid. No extraluminal gas or abscess identified. 2. Mild abdominal aortic atherosclerotic vascular calcification. 3. Right foraminal impingement at L5-S1 due to spurring. Electronically  Signed   By: Van Clines M.D.   On: 06/09/2021 14:22    Anti-infectives: Anti-infectives (From admission, onward)    Start     Dose/Rate Route Frequency Ordered Stop   06/09/21 2100  piperacillin-tazobactam (ZOSYN) IVPB 3.375 g        3.375 g 12.5 mL/hr over 240 Minutes Intravenous Every 8 hours 06/09/21 2014 06/14/21 2159   06/09/21 1430  cefTRIAXone (ROCEPHIN) 2 g in sodium chloride 0.9 % 100 mL IVPB       See Hyperspace for full Linked Orders Report.   2 g 200 mL/hr over 30 Minutes Intravenous  Once 06/09/21 1429 06/09/21 1510   06/09/21 1430  metroNIDAZOLE (FLAGYL) IVPB 500 mg       See Hyperspace for full Linked Orders Report.   500 mg 100 mL/hr over 60 Minutes Intravenous  Once 06/09/21 1429 06/09/21 1517        Assessment/Plan POD1 s/p laparoscopic appendectomy 06/09/21 Dr. Johney Maine Perforated appendicitis with fecalith - doing ok overall - continue to mobilize - ok for diet as tolerated - will recheck for possible readiness for discharge this afternoon, would like to see patient passing some flatus prior to DC with distention and given perforation  - continue abx  FEN: reg diet  VTE: LMWH ID: rocephin/flagyl; Zosyn 6/5>>  LOS: 0 days     Norm Parcel, [facility] 06/10/2021, 10:21 AM Please see Amion for pager number during day hours 7:00am-4:30pm

## 2021-06-10 NOTE — Progress Notes (Signed)
Transition of Care [facility]) Screening Note  Patient Details  Name: Tammy Tran Date of Birth: May 04, 1970  Transition of Care [facility]) CM/SW Contact:    Ewing Schlein, LCSW Phone Number: 06/10/2021, 12:45 PM  Transition of Care Department [facility]) has reviewed patient and no TOC needs have been identified at this time. We will continue to monitor patient advancement through interdisciplinary progression rounds. If new patient transition needs arise, please place a TOC consult

## 2021-06-11 LAB — CBC
HCT: 35.6 % — ABNORMAL LOW (ref 36.0–46.0)
Hemoglobin: 11.5 g/dL — ABNORMAL LOW (ref 12.0–15.0)
MCH: 29.9 pg (ref 26.0–34.0)
MCHC: 32.3 g/dL (ref 30.0–36.0)
MCV: 92.5 fL (ref 80.0–100.0)
Platelets: 284 10*3/uL (ref 150–400)
RBC: 3.85 MIL/uL — ABNORMAL LOW (ref 3.87–5.11)
RDW: 12.6 % (ref 11.5–15.5)
WBC: 15.4 10*3/uL — ABNORMAL HIGH (ref 4.0–10.5)
nRBC: 0 % (ref 0.0–0.2)

## 2021-06-11 LAB — BASIC METABOLIC PANEL
Anion gap: 6 (ref 5–15)
BUN: 12 mg/dL (ref 6–20)
CO2: 29 mmol/L (ref 22–32)
Calcium: 8 mg/dL — ABNORMAL LOW (ref 8.9–10.3)
Chloride: 100 mmol/L (ref 98–111)
Creatinine, Ser: 0.7 mg/dL (ref 0.44–1.00)
GFR, Estimated: >60 mL/min (ref >60)
Glucose, Bld: 123 mg/dL — ABNORMAL HIGH (ref 70–99)
Potassium: 3.5 mmol/L (ref 3.5–5.1)
Sodium: 135 mmol/L (ref 135–145)

## 2021-06-11 LAB — SURGICAL PATHOLOGY

## 2021-06-11 MED ORDER — IBUPROFEN 600 MG PO TABS: 600.0000 mg | ORAL_TABLET | Freq: Four times a day (QID) | ORAL | 0 refills | Status: AC | PRN

## 2021-06-11 MED ORDER — AMOXICILLIN-POT CLAVULANATE 875-125 MG PO TABS: 1.0000 | ORAL_TABLET | Freq: Two times a day (BID) | ORAL | 0 refills | Status: AC

## 2021-06-11 MED ORDER — ACETAMINOPHEN 500 MG PO TABS
1000.0000 mg | ORAL_TABLET | Freq: Three times a day (TID) | ORAL | Status: AC | PRN
Start: 2021-06-11 — End: ?

## 2021-06-11 MED ORDER — MAGNESIUM HYDROXIDE 400 MG/5ML PO SUSP
30.0000 mL | Freq: Two times a day (BID) | ORAL | Status: DC | PRN
  Administered 2021-06-11: 30 mL via ORAL

## 2021-06-11 MED ORDER — POTASSIUM CHLORIDE CRYS ER 20 MEQ PO TBCR
40.0000 meq | EXTENDED_RELEASE_TABLET | Freq: Once | ORAL | Status: AC
  Administered 2021-06-11: 40 meq via ORAL
  Filled 2021-06-11: qty 2

## 2021-06-11 MED ORDER — BISACODYL 10 MG RE SUPP
10.0000 mg | Freq: Two times a day (BID) | RECTAL | Status: DC | PRN
  Administered 2021-06-11: 10 mg via RECTAL

## 2021-06-11 MED ORDER — OXYCODONE HCL 5 MG PO TABS: 5.0000 mg | ORAL_TABLET | Freq: Four times a day (QID) | ORAL | 0 refills | Status: DC | PRN

## 2021-06-11 NOTE — Plan of Care (Signed)
Patient being discharged home. PIV removed per order. Discharge education completed and all questions answered. Patient sent home with all personal belongings.   Problem: Education: Goal: Required Educational Video(s) Outcome: Completed/Met   Problem: Clinical Measurements: Goal: Ability to maintain clinical measurements within normal limits will improve Outcome: Completed/Met Goal: Postoperative complications will be avoided or minimized Outcome: Completed/Met   Problem: Skin Integrity: Goal: Demonstration of wound healing without infection will improve Outcome: Completed/Met

## 2021-06-11 NOTE — Anesthesia Postprocedure Evaluation (Signed)
Anesthesia Post Note  Patient: Tammy Tran  Procedure(s) Performed: APPENDECTOMY LAPAROSCOPIC     Patient location during evaluation: PACU Anesthesia Type: General Level of consciousness: awake and alert Pain management: pain level controlled Vital Signs Assessment: post-procedure vital signs reviewed and stable Respiratory status: spontaneous breathing, nonlabored ventilation, respiratory function stable and patient connected to nasal cannula oxygen Cardiovascular status: blood pressure returned to baseline and stable Postop Assessment: no apparent nausea or vomiting Anesthetic complications: no   No notable events documented.  Last Vitals:  Vitals:   06/10/21 1348 06/11/21 0538  BP: 108/67 111/80  Pulse: 92 90  Resp: 18 16  Temp: 36.5 C 36.5 C  SpO2: 99% 100%    Last Pain:  Vitals:   06/11/21 0851  TempSrc:   PainSc: 2                  Nadia Viar S

## 2021-06-11 NOTE — Discharge Summary (Signed)
[facility] Discharge Summary   Patient ID: Tammy Tran MRN: [id number] DOB/AGE: 1970-05-01 51 y.o.  Admit date: 06/09/2021 Discharge date: 06/11/2021  Admitting Diagnosis: Acute appendicitis  Discharge Diagnosis Perforated acute appendicitis  S/P laparoscopic appendectomy   Consultants None   Imaging: CT ABDOMEN PELVIS W CONTRAST  Result Date: 06/09/2021 CLINICAL DATA:  Right lower quadrant abdominal pain starting 2 days ago EXAM: CT ABDOMEN AND PELVIS WITH CONTRAST TECHNIQUE: Multidetector CT imaging of the abdomen and pelvis was performed using the standard protocol following bolus administration of intravenous contrast. RADIATION DOSE REDUCTION: This exam was performed according to the departmental dose-optimization program which includes automated exposure control, adjustment of the mA and/or kV according to patient size and/or use of iterative reconstruction technique. CONTRAST:  OMNIPAQUE IOHEXOL 300 MG/ML  SOLN COMPARISON:  None Available. FINDINGS: Lower chest: Unremarkable Hepatobiliary: Unremarkable Pancreas: Unremarkable Spleen: Unremarkable Adrenals/Urinary Tract: 0.5 cm hypodense lesion of the right kidney lower pole on image 80 of series 4 highly likely to be a cyst but technically nonspecific due to small size. In general, such lesions are not felt to warrant further workup unless there is a specific renal clinical concern. Stomach/Bowel: Acute appendicitis is present, appendiceal diameter 1.5 cm on image 68 series 4 with substantial surrounding periappendiceal edema. No extraluminal gas or obvious drainable abscess. The appendix extends posterior from the cecum, superficial to the iliac vessels. No dilated bowel. No free intraperitoneal gas. Trace free fluid in the pelvis. Vascular/Lymphatic: Mild abdominal aortic atherosclerotic vascular disease. Reproductive: Unremarkable Other: No supplemental non-categorized findings. Musculoskeletal: Right foraminal  impingement at L5-S1 due to spurring. IMPRESSION: 1. Acute appendicitis. The appendix measures 1.5 cm in diameter with substantial periappendiceal edema, and there is a small amount of free pelvic fluid. No extraluminal gas or abscess identified. 2. Mild abdominal aortic atherosclerotic vascular calcification. 3. Right foraminal impingement at L5-S1 due to spurring. Electronically Signed   By: Gaylyn Rong M.D.   On: 06/09/2021 14:22    Procedures Dr. Karie Soda (06/09/21) - Laparoscopic Appendectomy  Hospital Course:  Patient is an otherwise healthy 51 year old female who presented to the ED with abdominal pain.  Workup showed acute appendicitis.  Patient was admitted and underwent procedure listed above. Intra-operatively appendix noted to be perforated with feculent peritonitis. Tolerated procedure well and was transferred to the floor.  Diet was advanced as tolerated.  On POD2, the patient was voiding well, tolerating diet, ambulating well, pain well controlled, vital signs stable, incisions c/d/i and felt stable for discharge home.  Patient will follow up in our office in 2 weeks with repeat CBC next week and knows to call with questions or concerns. She was discharged home on an additional 5 days of PO antibiotics.  Physical Exam: General:  Alert, NAD, pleasant, comfortable Abd:  Soft, ND, mild tenderness, incisions C/D/I  I or a member of my team have reviewed this patient in the Controlled Substance Database.   Allergies as of 06/11/2021       Reactions   Erythromycin Diarrhea, Nausea And Vomiting        Medication List     STOP taking these medications    GOODY HEADACHE PO       TAKE these medications    acetaminophen 500 MG tablet Commonly known as: TYLENOL Take 2 tablets (1,000 mg total) by mouth every 8 (eight) hours as needed for mild pain or fever.   amoxicillin-clavulanate 875-125 MG tablet Commonly known as: AUGMENTIN Take 1 tablet  by mouth 2 (two) times  daily for 5 days.   B-12 1000 MCG Tabs Take 1 tablet (1,000 mcg total) by mouth daily.   cetirizine 10 MG tablet Commonly known as: ZYRTEC Take 10 mg by mouth daily.   ibuprofen 600 MG tablet Commonly known as: ADVIL Take 1 tablet (600 mg total) by mouth every 6 (six) hours as needed. Take with food   Magnesium 300 MG Caps Take 1 capsule (300 mg total) by mouth daily.   Hair/Skin/Nails Caps Take 2 capsules by mouth daily.   multivitamin with minerals tablet Take 1 tablet by mouth daily.   oxyCODONE 5 MG immediate release tablet Commonly known as: Roxicodone Take 1 tablet (5 mg total) by mouth every 6 (six) hours as needed.   Super Omega 3 EPA/DHA 1000 MG Caps Take 2 caps by mouth once daily.   thiamine 100 MG tablet Take 1 tablet (100 mg total) by mouth daily.   traZODone 50 MG tablet Commonly known as: DESYREL TAKE 1 TABLET(50 MG) BY MOUTH AT BEDTIME AS NEEDED FOR SLEEP What changed: See the new instructions.   Vitamin D-3 25 MCG (1000 UT) Caps Take 1 capsule (1,000 Units total) by mouth daily.               Discharge Care Instructions  (From admission, onward)           Start     Ordered   06/09/21 0000  Discharge wound care:       Comments: It is good for closed incisions and even open wounds to be washed every day.  Shower every day.  Short baths are fine.  Wash the incisions and wounds clean with soap & water.    You may leave closed incisions open to air if it is dry.   You may cover the incision with clean gauze & replace it after your daily shower for comfort.  TEGADERM:  You have clear gauze band-aid dressings over your closed incision(s).  Remove the dressings 3 days after surgery.   06/09/21 1726              Follow-up Information     [facility], [facility]. Go on 06/24/2021.   Specialty: General Surgery Why: Follow up appointment with Saunders Glance, PA-C 11:45 PM. Please arrive 30 min prior to appointment time and have ID and  insurance card with you. Follow up at [facility] next week to have CBC drawn prior to follow up appointment. Contact information: [address] [place] [address] [phone]                 Signed: Juliet Rude , [facility] Surgery 06/11/2021, 2:11 PM Please see Amion for pager number during day hours 7:00am-4:30pm

## 2021-06-13 ENCOUNTER — Telehealth: Payer: Self-pay | Admitting: *Deleted

## 2021-06-13 NOTE — Telephone Encounter (Signed)
Transition Care Management Follow-up Telephone Call Date of discharge and from where: 06/13/21  How have you been since you were released from the hospital? "Just really sore" Any questions or concerns? No  Items Reviewed: Did the pt receive and understand the discharge instructions provided? Yes  Medications obtained and verified? Yes  Other? No  Any new allergies since your discharge? No  Dietary orders reviewed? Yes Do you have support at home? Yes   Home Care and Equipment/Supplies: Were home health services ordered? no If so, what is the name of the agency? Not applicable  Has the agency set up a time to come to the patient's home? not applicable Were any new equipment or medical supplies ordered?  No What is the name of the medical supply agency? Not applicable Were you able to get the supplies/equipment? not applicable Do you have any questions related to the use of the equipment or supplies? Not applicable  Functional Questionnaire: (I = Independent and D = Dependent) ADLs: I  Bathing/Dressing- I  Meal Prep- I  Eating- I  Maintaining continence- I  Transferring/Ambulation- I  Managing Meds- I  Follow up appointments reviewed:  PCP Hospital f/u appt confirmed? No  not indicated per hospital d/c instructions Specialist Hospital f/u appt confirmed? Yes  Scheduled to see Saunders Glance PA on 06/24/21 @ 11:45am. Are transportation arrangements needed? No  If their condition worsens, is the pt aware to call PCP or go to the Emergency Dept.? Yes Was the patient provided with contact information for the PCP's office or ED? Yes Was to pt encouraged to call back with questions or concerns? Yes   Cranford Mon RN, CCM, [facility]  [facility] Care Management Coordinator  [phone]

## 2021-12-01 ENCOUNTER — Telehealth: Payer: Self-pay

## 2021-12-01 NOTE — Telephone Encounter (Signed)
Nurse Assessment Nurse: Lynn Ito RN, Mardella Layman Date/Time (Eastern Time): 11/28/2021 9:06:37 AM Confirm and document reason for call. If symptomatic, describe symptoms. ---Caller states that she traveled to [place] in October and got home 10/24. Caller states that her allergies have been bad since, [facility]14 and got a 5 day antibiotic due to redness in her ears. Caller states that symptoms has not resolved. Taking allergy medications with no help. Caller states she has sinus pressure, ears itchy, left side of her nose is runny. Caller states she has tested for COVID and negative. Denies any other symptoms. Does the patient have any new or worsening symptoms? ---Yes Will a triage be completed? ---Yes Related visit to physician within the last 2 weeks? ---Yes Does the PT have any chronic conditions? (i.e. diabetes, asthma, this includes High risk factors for pregnancy, etc.) ---No Is the patient pregnant or possibly pregnant? (Ask all females between the ages of 49-55) ---No Is this a behavioral health or substance abuse call? ---No Guidelines Guideline Title Affirmed Question Affirmed Notes Nurse Date/Time (Eastern Time) Nasal Allergies (Hay Fever) [1] Lots of yellow or green discharge from Herman, RN, Mardella Layman 11/28/2021 9:11:32 AM PLEASE NOTE: All timestamps contained within this report are represented as [place] Standard Time. CONFIDENTIALTY NOTICE: This fax transmission is intended only for the addressee. It contains information that is legally privileged, confidential or otherwise protected from use or disclosure. If you are not the intended recipient, you are strictly prohibited from reviewing, disclosing, copying using or disseminating any of this information or taking any action in reliance on or regarding this information. If you have received this fax in error, please notify us immediately by telephone so that we can arrange for its return to [place].  Phone: [phone], Toll-Free: [phone], Fax: [phone] Page: 2 of 2 Call Id: [id number] Guidelines Guideline Title Affirmed Question Affirmed Notes Nurse Date/Time Lamount Cohen Time) nose AND [2] present > 3 days Disp. Time Lamount Cohen Time) Disposition Final User 11/28/2021 9:05:18 AM Send to Clinical Rich Reining, RN, Santina Evans 11/28/2021 9:17:00 AM See PCP within 24 Hours Yes Popejoy, RN, Mardella Layman Final Disposition 11/28/2021 9:17:00 AM See PCP within 24 Hours Yes Popejoy, RN, Verdis Frederickson Disagree/Comply Comply Caller Understands Yes PreDisposition Call Doctor Care Advice Given Per Guideline SEE PCP WITHIN 24 HOURS: * IF OFFICE WILL BE CLOSED: You need to be seen within the next 24 hours. A clinic or an urgent care center is often a good source of care if your doctor's office is closed or you can't get an appointment. NASAL WASHES FOR A STUFFY NOSE: NASAL WASHES - STEP-BY-STEP INSTRUCTIONS: HOW TO MAKE SALINE (SALT WATER) NASAL WASH: ANTIHISTAMINE MEDICINES - EXTRA NOTES AND WARNINGS: CALL BACK IF: * You become worse CARE ADVICE given per Nasal Allergies (Hay Fever) (Adult) guideline. * ACETAMINOPHEN - REGULAR STRENGTH TYLENOL: Take 650 mg (two 325 mg pills) by mouth every 4 to 6 hours as needed. Each Regular Strength Tylenol pill has 325 mg of acetaminophen. The most you should take is 10 pills a day (3,250 mg total). Note: In [place], the maximum is 12 pills a day (3,900 mg total). * ACETAMINOPHEN - EXTRA STRENGTH TYLENOL: Take 1,000 mg (two 500 mg pills) every 6 to 8 hours as needed. Each Extra Strength Tylenol pill has 500 mg of acetaminophen. The most you should take is 6 pills a day (3,000 mg total). Note: In [place], the maximum is 8 pills a day (4,000 mg total). * IBUPROFEN (E.G., MOTRIN, ADVIL): Take  400 mg (two 200 mg pills) by mouth every 6 hours. The most you should take is 6 pills a day (1,200 mg total). * LORATADINE (ALAVERT, CLARITIN): The adult dose is 10 mg. You take  it once a day. Loratadine is available in the [place] as Programmer, systems; it is available in [place] as Claritin. * CETIRIZINE (REACTINE, ZYRTEC): The adult dose is 10 mg. You take it once a day. Cetirizine is available in the [place] as Zyrtec and in [place] as Reactine. * They are over-the-counter (OTC) antihistamine medicines. You can buy them at a drugstore or grocery store. * FEXOFENADINE (ALLEGRA): In the [place], the adult dose is one 24- hour tablet (180 mg) once a day. In [place], the adult dose is one 24-hour tablet (120 mg) once a day. Or, you can take one 12-hour (60 mg) tablet 2 times a day. ANTIHISTAMINE MEDICINES FOR HAY FEVER: Referrals [facility] at [facility]

## 2021-12-01 NOTE — Telephone Encounter (Signed)
Left message on machine to call back to schedule. 

## 2021-12-01 NOTE — Telephone Encounter (Signed)
Appt scheduled

## 2021-12-02 ENCOUNTER — Encounter: Payer: Self-pay | Admitting: Family Medicine

## 2021-12-02 ENCOUNTER — Telehealth (INDEPENDENT_AMBULATORY_CARE_PROVIDER_SITE_OTHER): Payer: Commercial Managed Care - PPO | Admitting: Family Medicine

## 2021-12-02 DIAGNOSIS — J0100 Acute maxillary sinusitis, unspecified: Secondary | ICD-10-CM

## 2021-12-02 MED ORDER — PREDNISONE 20 MG PO TABS: 40.0000 mg | ORAL_TABLET | Freq: Every day | ORAL | 0 refills | Status: AC

## 2021-12-02 NOTE — Progress Notes (Signed)
Chief Complaint  Patient presents with   Sinusitis    Tammy Tran here for URI complaints. Due to COVID-19 pandemic, we are interacting via web portal for an electronic face-to-face visit. I verified patient's ID using 2 identifiers. Patient agreed to proceed with visit via this method. Patient is at home, I am at office. Patient and I are present for visit.   Duration: 5 weeks  Associated symptoms: sinus congestion, sinus pain, rhinorrhea, and itchy watery eyes Denies: ear pain, ear drainage, sore throat, wheezing, shortness of breath, myalgia, N/V, dental pain, and fevers Treatment to date: Dayquil, Sudafed, Mucinex, Amoxicillin Takes Zyrtec and Flonase routinely.  Sick contacts: No Tested neg for covid and flu.   Past Medical History:  Diagnosis Date   Allergy    Year-round. Multiple environmental allergies.   Chickenpox    5th grade   Objective No conversational dyspnea Age appropriate judgment and insight Nml affect and mood  Subacute maxillary sinusitis - Plan: predniSONE (DELTASONE) 20 MG tablet  5 d pred burst 40 mg/d. Hx of allergies. Cont Xyzal and INCS. If no improvement by weekend, she will message and I will send in Singulair and have her f/u in 1 mo. Could consider CT of sinsuses if no better at that point. Continue to push fluids, practice good hand hygiene, cover mouth when coughing. F/u prn. If starting to experience fevers, shaking, or shortness of breath, seek immediate care. Pt voiced understanding and agreement to the plan.  Jilda [place], DO 12/02/21 4:03 PM

## 2021-12-04 ENCOUNTER — Encounter: Payer: Self-pay | Admitting: Family Medicine

## 2021-12-04 ENCOUNTER — Other Ambulatory Visit: Payer: Self-pay | Admitting: Family Medicine

## 2021-12-04 MED ORDER — MONTELUKAST SODIUM 10 MG PO TABS: 10.0000 mg | ORAL_TABLET | Freq: Every day | ORAL | 1 refills | Status: DC

## 2022-01-07 ENCOUNTER — Other Ambulatory Visit: Payer: Self-pay | Admitting: Family Medicine

## 2022-03-04 ENCOUNTER — Encounter: Payer: Self-pay | Admitting: Family

## 2023-05-10 LAB — HM MAMMOGRAPHY

## 2023-05-17 ENCOUNTER — Telehealth: Admitting: Family Medicine

## 2023-05-21 ENCOUNTER — Telehealth: Admitting: Family

## 2023-05-22 LAB — COLOGUARD: COLOGUARD: NEGATIVE

## 2023-06-15 ENCOUNTER — Telehealth: Payer: Self-pay | Admitting: Family

## 2023-06-15 ENCOUNTER — Telehealth (INDEPENDENT_AMBULATORY_CARE_PROVIDER_SITE_OTHER): Admitting: Family

## 2023-06-15 DIAGNOSIS — E785 Hyperlipidemia, unspecified: Secondary | ICD-10-CM | POA: Diagnosis not present

## 2023-06-15 DIAGNOSIS — N951 Menopausal and female climacteric states: Secondary | ICD-10-CM | POA: Insufficient documentation

## 2023-06-15 DIAGNOSIS — Z0000 Encounter for general adult medical examination without abnormal findings: Secondary | ICD-10-CM

## 2023-06-15 NOTE — Assessment & Plan Note (Signed)
  Chronic hyperlipidemia with elevated cholesterol and LDL. Cardiovascular risk low (3.7 % 10 year CV risk per calculatory). Statin therapy not indicated. - Focus on maintaining a healthy diet, continuing to remain nicotine  free and continuing a healthy lifestyle.

## 2023-06-15 NOTE — Assessment & Plan Note (Signed)
 Request pap/mammo results from GYN. Cologuard is up to date.

## 2023-06-15 NOTE — Progress Notes (Signed)
 MyChart Video Visit    Virtual Visit via Video Note    Patient location: Home. Patient and provider in visit Provider location: Office  I discussed the limitations of evaluation and management by telemedicine and the availability of in person appointments. The patient expressed understanding and agreed to proceed.  Visit Date: 06/15/2023  Today's healthcare provider: Rochele Christmas, NP     Subjective:    Patient ID: Tammy Tran, female    DOB: 1970-04-06, 53 y.o.   MRN: [id number]  No chief complaint on file.   HPI Tammy Tran is a 53 year old female who presents for follow-up on elevated cholesterol levels.  Her recent lab results show a total cholesterol of 266 mg/dL, triglycerides of 84 mg/dL, HDL of 67 mg/dL, VLDL of 14 mg/dL, and LDL of 132 mg/dL, with an LDL/HDL ratio of 2.8. Previous results from 2022 indicated a total cholesterol of 262 mg/dL and LDL of 440 mg/dL.  She is in menopause and started hormone replacement therapy with an estrogen patch and progesterone tablets less than a month ago, experiencing improved sleep and reduced night sweats.  She is currently a non-smoker, abstains from alcohol, and exercises regularly. She is not diabetic and does not have hypertension. She is adopted and lacks detailed knowledge of her family cardiac history. Lab Results  Component Value Date   CHOL 262 (H) 06/07/2020   HDL 50.80 06/07/2020   LDLCALC 197 (H) 06/07/2020   TRIG 69.0 06/07/2020   CHOLHDL 5 06/07/2020    Past Medical History:  Diagnosis Date   Allergy    Year-round. Multiple environmental allergies.   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   Works as a Engineer, production at the [employer]   1 biological son born 2000   2 step sons (one lives in the house)   Has one grandchildren   Enjoys spending time with her dogs- she has 3 dogs, shopping   Social Drivers of Corporate investment banker Strain: Low Risk  (06/15/2023)   Overall Financial Resource Strain (CARDIA)    Difficulty of Paying Living Expenses: Not hard at all  Food Insecurity: No Food Insecurity (06/15/2023)   Hunger Vital Sign  Worried About Programme researcher, broadcasting/film/video in the Last Year: Never true    Ran Out of Food in the Last Year: Never true  Transportation Needs: No Transportation Needs (06/15/2023)   PRAPARE - Administrator, Civil Service (Medical): No    Lack of Transportation (Non-Medical): No  Physical Activity: Sufficiently Active (06/15/2023)   Exercise Vital Sign    Days of Exercise per Week: 4 days    Minutes of Exercise per Session: 60 min  Stress: No Stress Concern Present (06/15/2023)   [employer] of Occupational Health - Occupational Stress Questionnaire    Feeling of Stress : Only a little  Social Connections: Socially Isolated (06/15/2023)   Social Connection and  Isolation Panel [NHANES]    Frequency of Communication with Friends and Family: Once a week    Frequency of Social Gatherings with Friends and Family: Once a week    Attends Religious Services: Never    Database administrator or Organizations: No    Attends Engineer, structural: Not on file    Marital Status: Married  Catering manager Violence: Not on file    Outpatient Medications Prior to Visit  Medication Sig Dispense Refill   estradiol (VIVELLE-DOT) 0.05 MG/24HR patch Place 1 patch onto the skin 2 (two) times a week.     progesterone (PROMETRIUM) 100 MG capsule Take 100 mg by mouth daily.     acetaminophen  (TYLENOL ) 500 MG tablet Take 2 tablets (1,000 mg total) by mouth every 8 (eight) hours as needed for mild pain or fever.     cetirizine (ZYRTEC) 10 MG tablet Take 10 mg by mouth daily.     Cholecalciferol  (VITAMIN D -3) 1000 units CAPS Take 1 capsule (1,000 Units total) by mouth daily. 30 capsule 0   Cyanocobalamin  (B-12) 1000 MCG TABS Take 1 tablet (1,000 mcg total) by mouth daily. 30 tablet    ibuprofen  (ADVIL ) 600 MG tablet Take 1 tablet (600 mg total) by mouth every 6 (six) hours as needed. Take with food 30 tablet 0   Magnesium  300 MG CAPS Take 1 capsule (300 mg total) by mouth daily.  0   montelukast  (SINGULAIR ) 10 MG tablet TAKE 1 TABLET(10 MG) BY MOUTH AT BEDTIME 30 tablet 1   Multiple Vitamins-Minerals (HAIR/SKIN/NAILS) CAPS Take 2 capsules by mouth daily.     Multiple Vitamins-Minerals (MULTIVITAMIN WITH MINERALS) tablet Take 1 tablet by mouth daily.     Omega-3 Fatty Acids (SUPER OMEGA 3 EPA/DHA) 1000 MG CAPS Take 2 caps by mouth once daily. 60 capsule    thiamine  100 MG tablet Take 1 tablet (100 mg total) by mouth daily.     traZODone  (DESYREL ) 50 MG tablet TAKE 1 TABLET(50 MG) BY MOUTH AT BEDTIME AS NEEDED FOR SLEEP (Patient taking differently: Take 50-100 mg by mouth at bedtime as needed for sleep.) 90 tablet 1   No facility-administered medications prior to  visit.    Allergies  Allergen Reactions   Erythromycin Diarrhea and Nausea And Vomiting    ROS See HPI    Objective:     Physical Exam  There were no vitals taken for this visit. Wt Readings from Last 3 Encounters:  06/09/21 140 lb (63.5 kg)  08/28/20 135 lb (61.2 kg)  06/07/20 137 lb (62.1 kg)   Gen: Awake, alert, no acute distress Resp: Breathing is even and non-labored Psych: calm/pleasant demeanor Neuro: Alert and Oriented x 3, + facial symmetry, speech is clear.      Assessment &  Plan:   Problem List Items Addressed This Visit       Unprioritized   Preventative health care   Request pap/mammo results from GYN. Cologuard is up to date.      Menopausal syndrome    Postmenopausal on HRT with improved symptoms. - Continue current HRT regimen per GYN.       Hyperlipidemia - Primary    Chronic hyperlipidemia with elevated cholesterol and LDL. Cardiovascular risk low (3.7 % 10 year CV risk per calculatory). Statin therapy not indicated. - Focus on maintaining a healthy diet, continuing to remain nicotine  free and continuing a healthy lifestyle.       I am having Tammy Eden. Tran maintain her Vitamin D -3, B-12, cetirizine, thiamine , Magnesium , Super Omega 3 EPA/DHA, multivitamin with minerals, traZODone , Hair/Skin/Nails, acetaminophen , ibuprofen , montelukast , estradiol, and progesterone.  No orders of the defined types were placed in this encounter.   I discussed the assessment and treatment plan with the patient. The patient was provided an opportunity to ask questions and all were answered. The patient agreed with the plan and demonstrated an understanding of the instructions.   The patient was advised to call back or seek an in-person evaluation if the symptoms worsen or if the condition fails to improve as anticipated.  Rochele Christmas, NP [facility] [phone] (phone) [phone] (fax)  [facility]

## 2023-06-15 NOTE — Assessment & Plan Note (Signed)
  Postmenopausal on HRT with improved symptoms. - Continue current HRT regimen per GYN.

## 2023-06-15 NOTE — Telephone Encounter (Signed)
 Please request mammo and pap from [facility].

## 2023-06-15 NOTE — Telephone Encounter (Signed)
 Electronic request made

## 2023-06-15 NOTE — Patient Instructions (Signed)
 VISIT SUMMARY:  Today, we reviewed your recent lab results and discussed your elevated cholesterol levels. We also talked about your menopause symptoms and hormone replacement therapy.  YOUR PLAN:  MENOPAUSE: You are experiencing menopause and are on hormone replacement therapy (HRT). -Continue your current HRT regimen as it is helping with your symptoms.  HYPERLIPIDEMIA: You have chronic high cholesterol and LDL levels, but your cardiovascular risk is low. -Focus on maintaining a healthy diet and lifestyle to manage your cholesterol levels.  GENERAL HEALTH MAINTENANCE: We discussed your general health maintenance needs. -Schedule a physical examination this summer. -Get the shingles vaccine during your physical examination.

## 2023-08-12 IMAGING — CT CT ABD-PELV W/ CM
2 of 4 series · 16 of 46 positions shown, 18 images · IV contrast (OMNIPAQUE 300)
Comparison: None Available.

CLINICAL DATA: Right lower quadrant abdominal pain starting 2 days
ago

EXAM:
CT ABDOMEN AND PELVIS WITH CONTRAST
TECHNIQUE: Multidetector CT imaging of the abdomen and pelvis was performed
using the standard protocol following bolus administration of
intravenous contrast.

[Series 2: axial st · axial · 0.68mm/px · z∈[-491,-106]mm · 13 of 87 slices shown, 15 images]
[im 5/87  soft-tissue]
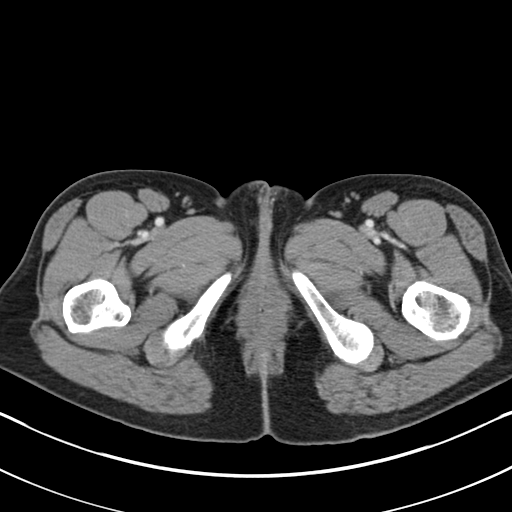
[im 5/87  bone]
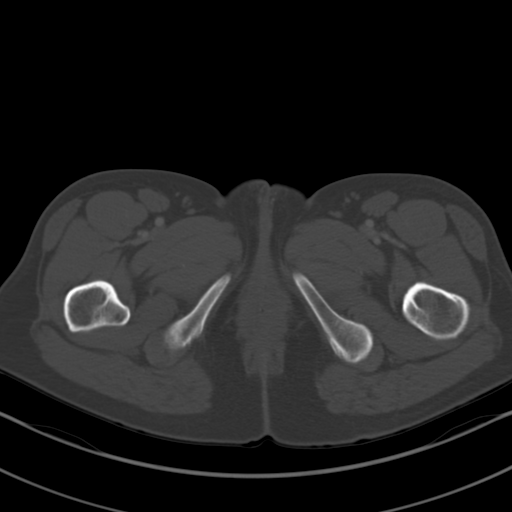
[im 13/87  soft-tissue]
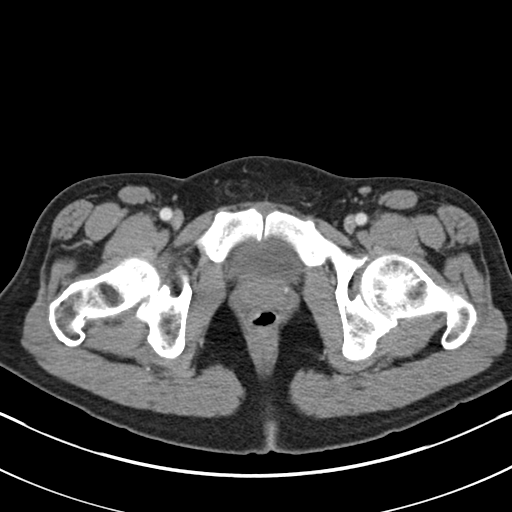
[im 18/87  soft-tissue]
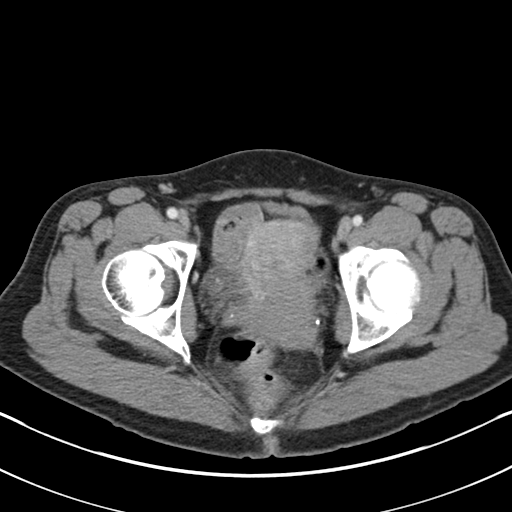
[im 26/87  soft-tissue]
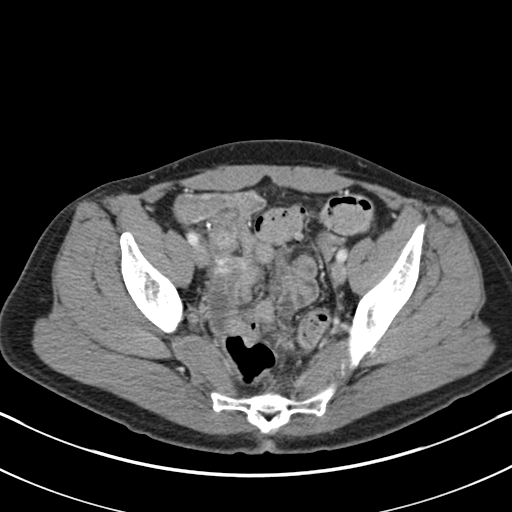
[im 31/87  soft-tissue]
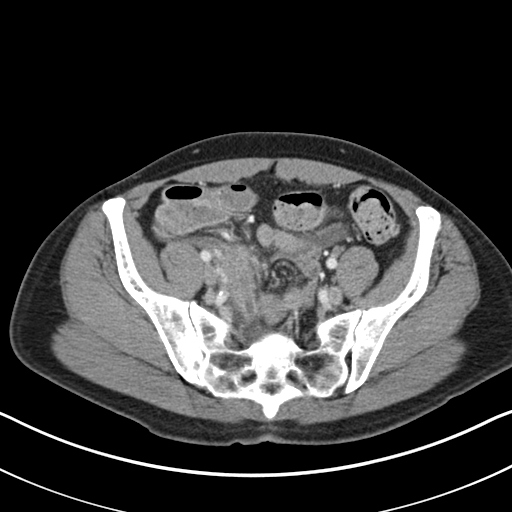
[im 39/87  soft-tissue]
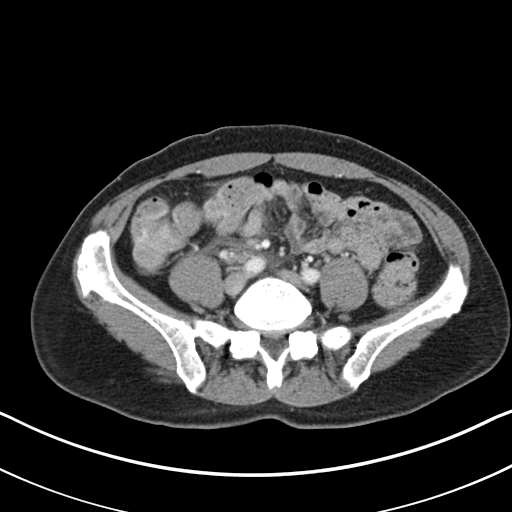
[im 44/87  soft-tissue]
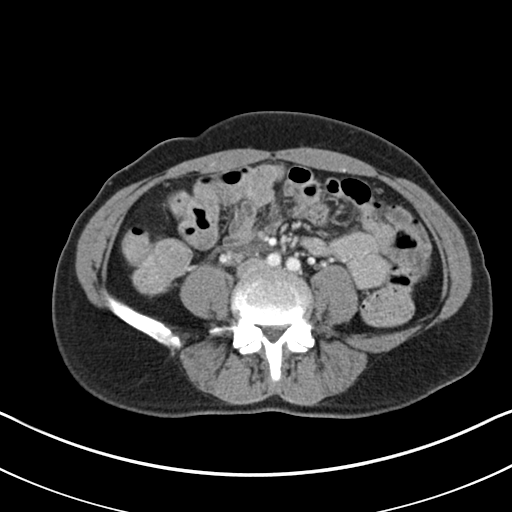
[im 48/87  soft-tissue]
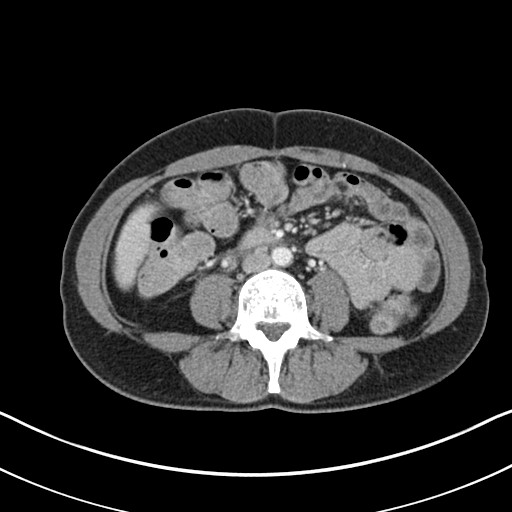
[im 56/87  soft-tissue]
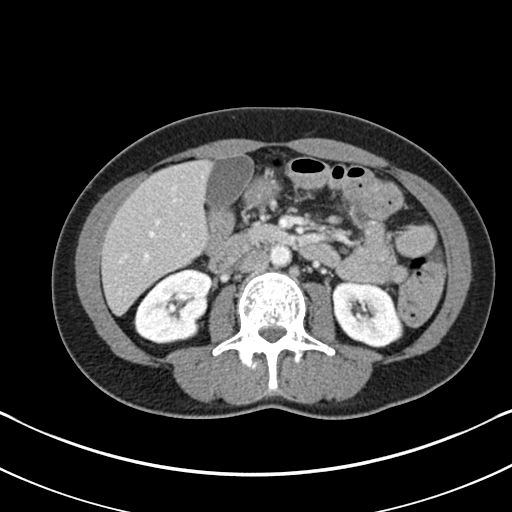
[im 56/87  bone]
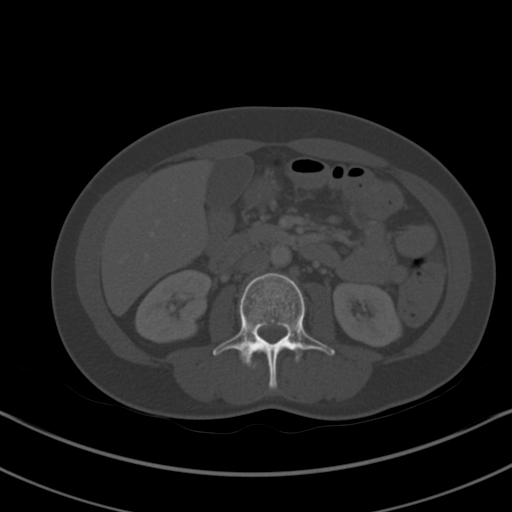
[im 61/87  soft-tissue]
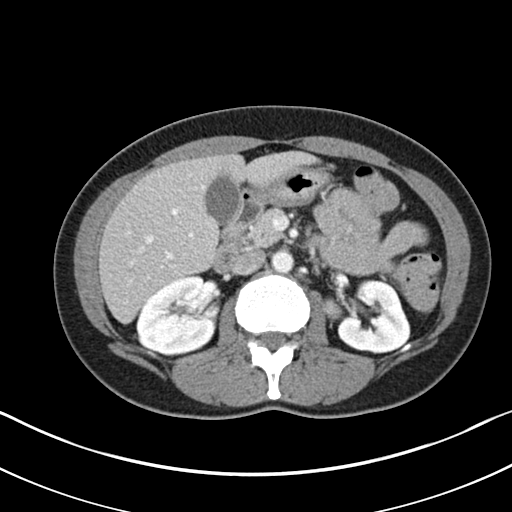
[im 69/87  soft-tissue]
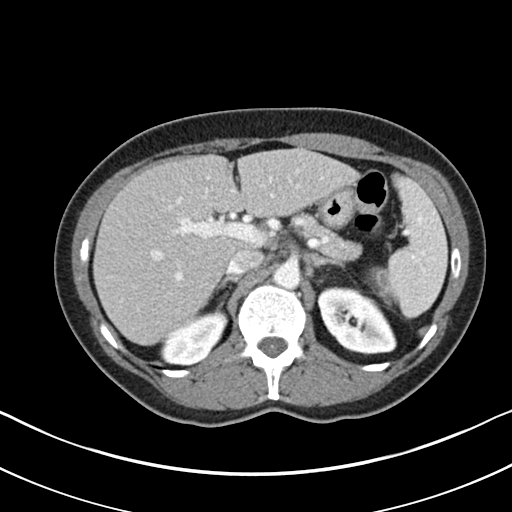
[im 74/87  soft-tissue]
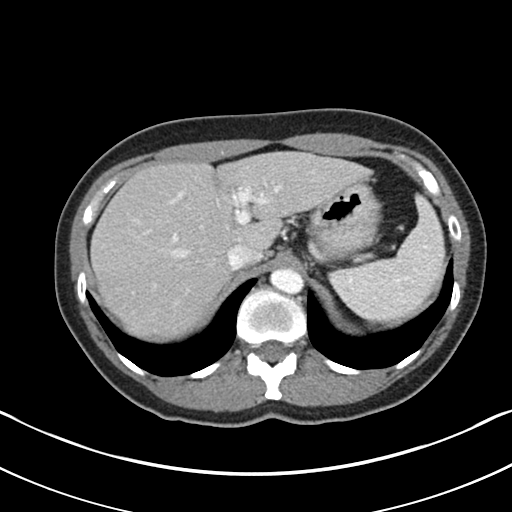
[im 82/87  soft-tissue]
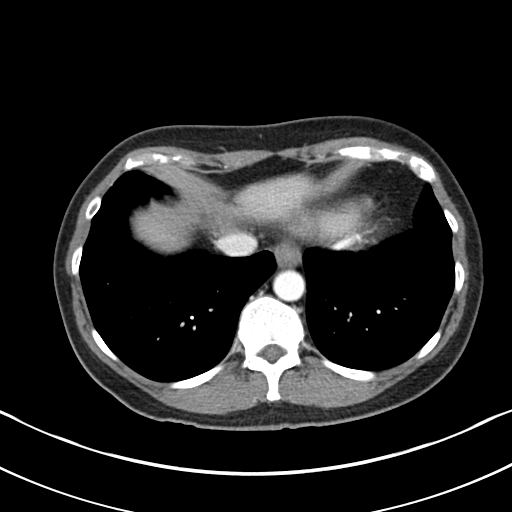

[Series 4: coronal st · coronal · 0.76mm/px · 3 of 124 slices shown]
[im 42/124  soft-tissue]
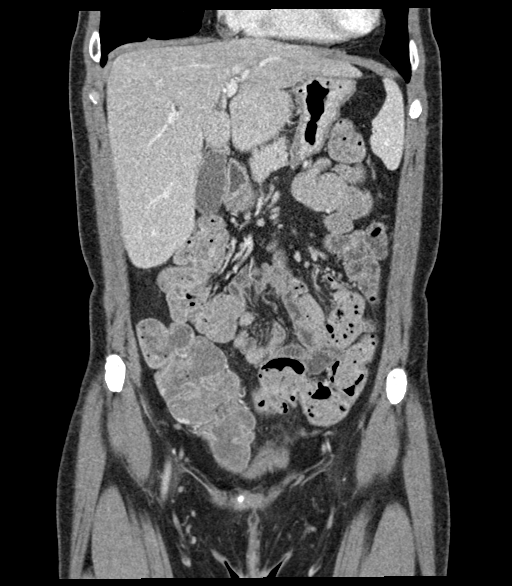
[im 55/124  soft-tissue]
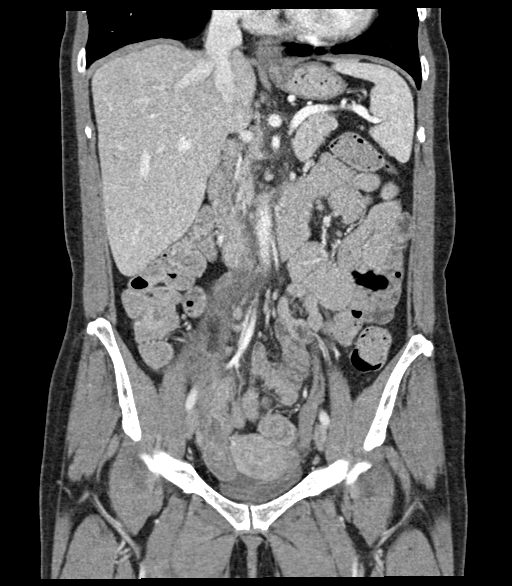
[im 69/124  soft-tissue]
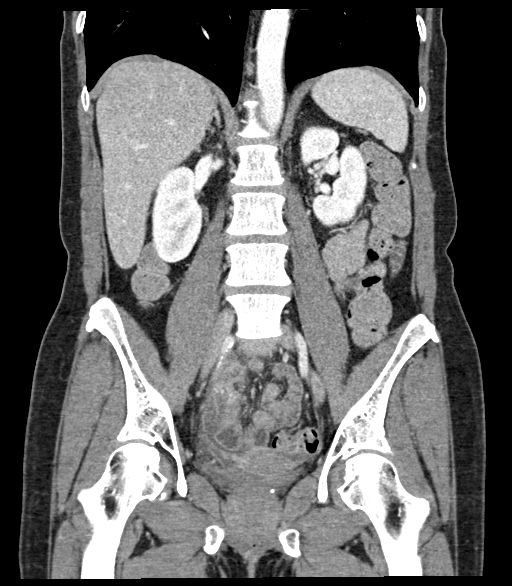

[16 of 46 positions shown; findings below may reference images not displayed]

RADIATION DOSE REDUCTION: This exam was performed according to the
departmental dose-optimization program which includes automated
exposure control, adjustment of the mA and/or kV according to
patient size and/or use of iterative reconstruction technique.

CONTRAST:  100mL OMNIPAQUE IOHEXOL 300 MG/ML  SOLN
FINDINGS: Lower chest: Unremarkable

Hepatobiliary: Unremarkable

Pancreas: Unremarkable

Spleen: Unremarkable

Adrenals/Urinary Tract: 0.5 cm hypodense lesion of the right kidney
lower pole on image 80 of series 4 highly likely to be a cyst but
technically nonspecific due to small size. In general, such lesions
are not felt to warrant further workup unless there is a specific
renal clinical concern.

Stomach/Bowel: Acute appendicitis is present, appendiceal diameter
1.5 cm on image 68 series 4 with substantial surrounding
periappendiceal edema. No extraluminal gas or obvious drainable
abscess. The appendix extends posterior from the cecum, superficial
to the iliac vessels. No dilated bowel. No free intraperitoneal gas.
Trace free fluid in the pelvis.

Vascular/Lymphatic: Mild abdominal aortic atherosclerotic vascular
disease.

Reproductive: Unremarkable

Other: No supplemental non-categorized findings.

Musculoskeletal: Right foraminal impingement at L5-S1 due to
spurring.
IMPRESSION: 1. Acute appendicitis. The appendix measures 1.5 cm in diameter with
substantial periappendiceal edema, and there is a small amount of
free pelvic fluid. No extraluminal gas or abscess identified.
2. Mild abdominal aortic atherosclerotic vascular calcification.
3. Right foraminal impingement at L5-S1 due to spurring.
